# Patient Record
Sex: Female | Born: 1980 | Race: Black or African American | Hispanic: No | Marital: Single | Smoking: Never smoker
Health system: Southern US, Community
[De-identification: ages and names within clinical notes are randomized; demographics above are authoritative.]

## PROBLEM LIST (undated history)

## (undated) DIAGNOSIS — R87619 Unspecified abnormal cytological findings in specimens from cervix uteri: Secondary | ICD-10-CM

## (undated) DIAGNOSIS — M549 Dorsalgia, unspecified: Secondary | ICD-10-CM

## (undated) DIAGNOSIS — IMO0002 Reserved for concepts with insufficient information to code with codable children: Secondary | ICD-10-CM

## (undated) HISTORY — PX: COLPOSCOPY: SHX161

---

## 2003-11-18 ENCOUNTER — Inpatient Hospital Stay (HOSPITAL_COMMUNITY): Admission: EM | Admit: 2003-11-18 | Discharge: 2003-11-20 | Payer: Self-pay | Admitting: Emergency Medicine

## 2003-11-19 ENCOUNTER — Ambulatory Visit: Payer: Self-pay | Admitting: Internal Medicine

## 2005-02-03 ENCOUNTER — Emergency Department (HOSPITAL_COMMUNITY): Admission: EM | Admit: 2005-02-03 | Discharge: 2005-02-03 | Payer: Self-pay | Admitting: Emergency Medicine

## 2006-01-20 ENCOUNTER — Emergency Department (HOSPITAL_COMMUNITY): Admission: EM | Admit: 2006-01-20 | Discharge: 2006-01-20 | Payer: Self-pay | Admitting: Emergency Medicine

## 2006-01-31 ENCOUNTER — Emergency Department (HOSPITAL_COMMUNITY): Admission: EM | Admit: 2006-01-31 | Discharge: 2006-01-31 | Payer: Self-pay | Admitting: Family Medicine

## 2006-02-10 ENCOUNTER — Inpatient Hospital Stay (HOSPITAL_COMMUNITY): Admission: AD | Admit: 2006-02-10 | Discharge: 2006-02-10 | Payer: Self-pay | Admitting: Gynecology

## 2006-04-08 ENCOUNTER — Ambulatory Visit (HOSPITAL_COMMUNITY): Admission: RE | Admit: 2006-04-08 | Discharge: 2006-04-08 | Payer: Self-pay | Admitting: Gynecology

## 2006-05-12 ENCOUNTER — Emergency Department (HOSPITAL_COMMUNITY): Admission: EM | Admit: 2006-05-12 | Discharge: 2006-05-12 | Payer: Self-pay | Admitting: Emergency Medicine

## 2006-05-12 ENCOUNTER — Inpatient Hospital Stay (HOSPITAL_COMMUNITY): Admission: AD | Admit: 2006-05-12 | Discharge: 2006-05-12 | Payer: Self-pay | Admitting: Gynecology

## 2006-08-13 ENCOUNTER — Inpatient Hospital Stay (HOSPITAL_COMMUNITY): Admission: AD | Admit: 2006-08-13 | Discharge: 2006-08-13 | Payer: Self-pay | Admitting: Gynecology

## 2006-08-20 ENCOUNTER — Emergency Department (HOSPITAL_COMMUNITY): Admission: EM | Admit: 2006-08-20 | Discharge: 2006-08-20 | Payer: Self-pay | Admitting: Emergency Medicine

## 2006-08-29 ENCOUNTER — Encounter: Payer: Self-pay | Admitting: Family Medicine

## 2006-08-29 ENCOUNTER — Ambulatory Visit: Payer: Self-pay | Admitting: Family Medicine

## 2006-08-29 ENCOUNTER — Inpatient Hospital Stay (HOSPITAL_COMMUNITY): Admission: AD | Admit: 2006-08-29 | Discharge: 2006-09-01 | Payer: Self-pay | Admitting: Family Medicine

## 2007-01-05 ENCOUNTER — Encounter: Admission: RE | Admit: 2007-01-05 | Discharge: 2007-01-05 | Payer: Self-pay | Admitting: Internal Medicine

## 2008-02-15 ENCOUNTER — Inpatient Hospital Stay (HOSPITAL_COMMUNITY): Admission: AD | Admit: 2008-02-15 | Discharge: 2008-02-15 | Payer: Self-pay | Admitting: Obstetrics & Gynecology

## 2008-02-15 ENCOUNTER — Emergency Department (HOSPITAL_COMMUNITY): Admission: EM | Admit: 2008-02-15 | Discharge: 2008-02-15 | Payer: Self-pay | Admitting: Family Medicine

## 2008-02-18 ENCOUNTER — Inpatient Hospital Stay (HOSPITAL_COMMUNITY): Admission: AD | Admit: 2008-02-18 | Discharge: 2008-02-18 | Payer: Self-pay | Admitting: Obstetrics & Gynecology

## 2008-03-30 ENCOUNTER — Inpatient Hospital Stay (HOSPITAL_COMMUNITY): Admission: AD | Admit: 2008-03-30 | Discharge: 2008-03-30 | Payer: Self-pay | Admitting: Obstetrics & Gynecology

## 2008-04-02 ENCOUNTER — Ambulatory Visit (HOSPITAL_COMMUNITY): Admission: AD | Admit: 2008-04-02 | Discharge: 2008-04-02 | Payer: Self-pay | Admitting: Obstetrics & Gynecology

## 2008-04-02 ENCOUNTER — Encounter: Payer: Self-pay | Admitting: Obstetrics & Gynecology

## 2008-04-02 ENCOUNTER — Ambulatory Visit: Payer: Self-pay | Admitting: Obstetrics and Gynecology

## 2008-11-01 ENCOUNTER — Inpatient Hospital Stay (HOSPITAL_COMMUNITY): Admission: AD | Admit: 2008-11-01 | Discharge: 2008-11-01 | Payer: Self-pay | Admitting: Family Medicine

## 2008-12-27 ENCOUNTER — Emergency Department (HOSPITAL_COMMUNITY): Admission: EM | Admit: 2008-12-27 | Discharge: 2008-12-27 | Payer: Self-pay | Admitting: Family Medicine

## 2008-12-28 ENCOUNTER — Ambulatory Visit: Payer: Self-pay | Admitting: Obstetrics and Gynecology

## 2008-12-30 ENCOUNTER — Ambulatory Visit (HOSPITAL_COMMUNITY): Admission: RE | Admit: 2008-12-30 | Discharge: 2008-12-30 | Payer: Self-pay | Admitting: Obstetrics & Gynecology

## 2009-01-16 ENCOUNTER — Ambulatory Visit: Payer: Self-pay | Admitting: Family Medicine

## 2009-01-16 ENCOUNTER — Encounter: Payer: Self-pay | Admitting: Family Medicine

## 2009-01-16 LAB — CONVERTED CEMR LAB: ABO/RH(D): O POS

## 2009-01-19 LAB — CONVERTED CEMR LAB
Antibody Screen: NEGATIVE
Basophils Absolute: 0 10*3/uL (ref 0.0–0.1)
Basophils Relative: 0 % (ref 0–1)
Eosinophils Absolute: 0 10*3/uL (ref 0.0–0.7)
Eosinophils Relative: 1 % (ref 0–5)
HCT: 37.4 % (ref 36.0–46.0)
Hemoglobin: 12.8 g/dL (ref 12.0–15.0)
Hepatitis B Surface Ag: NEGATIVE
Lymphocytes Relative: 38 % (ref 12–46)
Lymphs Abs: 2.2 10*3/uL (ref 0.7–4.0)
MCHC: 34.2 g/dL (ref 30.0–36.0)
MCV: 84.2 fL (ref 78.0–100.0)
Monocytes Absolute: 0.4 10*3/uL (ref 0.1–1.0)
Monocytes Relative: 7 % (ref 3–12)
Neutro Abs: 3.2 10*3/uL (ref 1.7–7.7)
Neutrophils Relative %: 54 % (ref 43–77)
Platelets: 231 10*3/uL (ref 150–400)
RBC: 4.44 M/uL (ref 3.87–5.11)
RDW: 13.3 % (ref 11.5–15.5)
Rh Type: POSITIVE
Rubella: 3.8 intl units/mL
Sickle Cell Screen: NEGATIVE
WBC: 5.9 10*3/uL (ref 4.0–10.5)

## 2009-01-23 ENCOUNTER — Ambulatory Visit: Payer: Self-pay | Admitting: Family Medicine

## 2009-01-23 ENCOUNTER — Encounter: Payer: Self-pay | Admitting: Family Medicine

## 2009-01-23 ENCOUNTER — Other Ambulatory Visit: Admission: RE | Admit: 2009-01-23 | Discharge: 2009-01-23 | Payer: Self-pay | Admitting: Family Medicine

## 2009-01-23 LAB — CONVERTED CEMR LAB
Chlamydia, DNA Probe: NEGATIVE
GC Probe Amp, Genital: NEGATIVE
Whiff Test: NEGATIVE

## 2009-01-26 ENCOUNTER — Encounter: Payer: Self-pay | Admitting: Family Medicine

## 2009-01-26 DIAGNOSIS — O34219 Maternal care for unspecified type scar from previous cesarean delivery: Secondary | ICD-10-CM | POA: Insufficient documentation

## 2009-02-13 ENCOUNTER — Telehealth: Payer: Self-pay | Admitting: Family Medicine

## 2009-02-14 ENCOUNTER — Ambulatory Visit (HOSPITAL_COMMUNITY): Admission: RE | Admit: 2009-02-14 | Discharge: 2009-02-14 | Payer: Self-pay | Admitting: Obstetrics and Gynecology

## 2009-02-14 ENCOUNTER — Ambulatory Visit: Payer: Self-pay | Admitting: Obstetrics and Gynecology

## 2009-02-14 ENCOUNTER — Encounter: Payer: Self-pay | Admitting: Family Medicine

## 2009-02-14 ENCOUNTER — Ambulatory Visit (HOSPITAL_COMMUNITY): Admission: RE | Admit: 2009-02-14 | Discharge: 2009-02-14 | Payer: Self-pay | Admitting: Family Medicine

## 2009-02-14 ENCOUNTER — Ambulatory Visit: Payer: Self-pay | Admitting: Family Medicine

## 2009-02-15 ENCOUNTER — Encounter: Payer: Self-pay | Admitting: *Deleted

## 2009-02-17 ENCOUNTER — Encounter: Payer: Self-pay | Admitting: *Deleted

## 2009-02-24 ENCOUNTER — Telehealth (INDEPENDENT_AMBULATORY_CARE_PROVIDER_SITE_OTHER): Payer: Self-pay | Admitting: *Deleted

## 2009-03-15 ENCOUNTER — Encounter: Payer: Self-pay | Admitting: Family Medicine

## 2009-03-15 ENCOUNTER — Ambulatory Visit: Payer: Self-pay | Admitting: Obstetrics and Gynecology

## 2009-09-03 ENCOUNTER — Emergency Department (HOSPITAL_COMMUNITY): Admission: EM | Admit: 2009-09-03 | Discharge: 2009-09-03 | Payer: Self-pay | Admitting: Emergency Medicine

## 2009-09-18 ENCOUNTER — Inpatient Hospital Stay (HOSPITAL_COMMUNITY): Admission: AD | Admit: 2009-09-18 | Discharge: 2009-09-18 | Payer: Self-pay | Admitting: Obstetrics

## 2009-10-19 ENCOUNTER — Ambulatory Visit (HOSPITAL_COMMUNITY): Admission: RE | Admit: 2009-10-19 | Discharge: 2009-10-19 | Payer: Self-pay | Admitting: Sports Medicine

## 2009-10-19 ENCOUNTER — Encounter: Payer: Self-pay | Admitting: Sports Medicine

## 2009-10-19 ENCOUNTER — Encounter: Payer: Self-pay | Admitting: *Deleted

## 2009-10-19 ENCOUNTER — Ambulatory Visit: Payer: Self-pay | Admitting: Family Medicine

## 2009-11-20 ENCOUNTER — Encounter: Payer: Self-pay | Admitting: *Deleted

## 2009-12-05 ENCOUNTER — Ambulatory Visit (HOSPITAL_COMMUNITY): Admission: RE | Admit: 2009-12-05 | Discharge: 2009-12-05 | Payer: Self-pay | Admitting: Obstetrics

## 2010-02-27 NOTE — Miscellaneous (Signed)
Summary: OB ultrasound approved  Clinical Lists Changes medsolutions approved [place] #[id number].Marland KitchenGolden Circle RN  February 17, 2009 11:10 AM

## 2010-02-27 NOTE — Miscellaneous (Signed)
Summary: ob US approved  Clinical Lists Changes medsolutions approved the OB ultrasound #[id number].Marland KitchenGolden Circle RN  February 15, 2009 9:29 AM

## 2010-02-27 NOTE — Progress Notes (Signed)
Summary: triage  Phone Note Call from Patient Call back at [phone]   Caller: Patient Summary of Call: pt is pregnant and needs to talk to nurse Initial call taken by: De Nurse,  February 13, 2009 11:03 AM  Follow-up for Phone Call        left message Follow-up by: Golden Circle RN,  February 13, 2009 11:20 AM  Additional Follow-up for Phone Call Additional follow up Details #1::        states she is nervous since she had a miscarriage before. states she is almost 3 months. denies bleeding or discharge. no vomiting or cramps. states she does not feel pregnant any more. wants to be checked. appt tomorrow at 8:30. unable to come in today. has to work so could not come in pm to see pcp Additional Follow-up by: Golden Circle RN,  February 13, 2009 11:27 AM

## 2010-02-27 NOTE — Progress Notes (Signed)
Summary: triage  Phone Note Call from Patient Call back at Home Phone [phone]   Caller: Patient Summary of Call: wants to talk to nurse about her [facility] on the 18th and is still bleeding Initial call taken by: De Nurse,  February 24, 2009 2:14 PM  Follow-up for Phone Call        patient states she has a light bleeding using one to two pads daily. she does have some pain in right abdomen. no fever. consulted Dr. Darra Lis preceptor and she advises if any pain or fever she needs to be checked out at [facility] today. patient advised and voices understanding. also states she has a bad headache that tylenol is not helping. she has taken [facility] and does help. advised against BC and advised may try Ibuprofen. Follow-up by: Theresia Lo RN,  February 24, 2009 2:54 PM

## 2010-02-27 NOTE — Letter (Signed)
Summary: Out of Work  [facility]  [address]   [place], [place] [address]   Phone: [phone]  Fax: [phone]    October 19, 2009   Employee:  LUCETTE KRATZ    To Whom It May Concern:   For Medical reasons, please excuse the above named employee from work for the following dates:  Start:   09.22.2011    End:   09.22.2011  If you need additional information, please feel free to contact our office.         Sincerely,    Loralee Pacas CMA

## 2010-02-27 NOTE — Assessment & Plan Note (Signed)
Summary: fell last night & i spreg/[place]  fell last night & w/hx of miscarriages is worried about the pregnancy.Golden Circle RN  October 19, 2009 11:20 AM  Vital Signs:  Patient profile:   30 year old female Height:      64 inches Weight:      214 pounds BMI:     36.87 Temp:     98.1 degrees F Pulse rate:   50 / minute BP sitting:   110 / 74  Vitals Entered By: Golden Circle RN (October 19, 2009 11:19 AM)   Primary Care Provider:  Doree Albee MD   History of Present Illness: 30 yo [id number] at 12.1 wks here after a fall.  She gets her [facility] with Dr. Gaynell Face.  FEll last night, slipped and hit bottom, no LOC, did not hit belly at all.  No bleeding, no abd pain, no cramping, no fluid leakage.   Mild pain in low back.   Ambulatory.  Habits & Providers  Alcohol-Tobacco-Diet     Alcohol drinks/day: 0     Tobacco Status: never  Exercise-Depression-Behavior     Drug Use: never     Seat Belt Use: always  Current Medications (verified): 1)  None  Allergies (verified): No Known Drug Allergies  Social History: Risk analyst Use:  always Drug Use:  never  Review of Systems       See HPI  Physical Exam  General:  Well-developed,well-nourished,in no acute distress; alert,appropriate and cooperative throughout examination Lungs:  Normal respiratory effort, chest expands symmetrically. Lungs are clear to auscultation, no crackles or wheezes. Heart:  Normal rate and regular rhythm. S1 and S2 normal without gallop, murmur, click, rub or other extra sounds. Abdomen:  Gravid, appropriate for dates. Additional Exam:  Unable to doppler FHR, Office ultrasound used and fetus seen, good FHR at 145 BPM, placenta unremarkable.  Fetal movement seen.   Impression & Recommendations:  Problem # 1:  ACCIDENTAL FALL (ICD-E888.9) Assessment New In a [id number] at 12.1 weeks with 2 prior spont abortions and 1 therapeutic abortion.  Good FHR and fetal movement on office ultrasound, no  bleeding or cramping, sending to [facility] for official US scheduled at 2:30pm today.  Orders: [facility]- Est  Level 4 (06269) Prenatal U/S < 14 weeks - 48546  (Prenatal U/S)  Problem # 2:  PREGNANT STATE, INCIDENTAL (ICD-V22.2) Assessment: Unchanged See #1, followed by Dr. Gaynell Face.  Will forward office note and [place] results to him.  Patient Instructions: 1)  Great to meet you, 2)  We saw the baby's heart beating today. 3)  Please get your ultrasound. 4)  Also follow up with Dr Gaynell Face regarding your fall and prenatal care. 5)  Call if bleeding, cramps, or fluids leakage. 6)  -Dr. 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 test: 12/17/2008   Flowsheet View for Follow-up Visit    Weight:     214    Blood pressure:   110 / 74

## 2010-02-27 NOTE — Miscellaneous (Signed)
Summary: Walk in triage  Patient walked into clinic this afternoon at 4:15 pm concerned that she may be having a miscarriage.  States that she does not feel pregnant anymore and has been having a small amount of d/c over the past week.  Describes the d/c as clear initially but today it was pink tinged.  The amount is not even enough to saturate her underwear.  Denies abdominal pain, pressure or contractions.  Pt is not even receiving her OB care here.  Advised her to go to [facility] to be evaluated.  Pt insisting that she be seen.  Began crying stating that she has had 2 previous miscarriages and was concerned that she was losing this baby as well.  I took pt to an exam room and doppler revealed a FHR of 159 to 163.  Reassured pt that all was okay, that it was normal to have occasional spotting during her pregnancy.  Advised her to go to [facility] tonight if the discharge became bloody or if she developed abdominal pains.  Pt agreeable.

## 2010-02-27 NOTE — Assessment & Plan Note (Signed)
Summary: no longer feels pregnant/[place]   Vital Signs:  Patient profile:   30 year old female Weight:      212.3 pounds Pulse rate:   88 / minute BP sitting:   117 / 79  (right arm)  Vitals Entered By: Renato Battles slade,cma CC: pt states 'i do not feel pregnant anymore since saturday. no nausea. just a feeling that i am not pregnant'. this is pt's 5th pregnancy and pt had miscarriage in 3-10 PT DENIES PAIN, BLEEDING, CRAMPING. Is Patient Diabetic? No Pain Assessment Patient in pain? no        Primary Care Provider:  Doree Albee MD  CC:  pt states 'i do not feel pregnant anymore since saturday. no nausea. just a feeling that i am not pregnant'. this is pt's 5th pregnancy and pt had miscarriage in 3-10 PT DENIES PAIN, BLEEDING, and CRAMPING.Marland Kitchen  History of Present Illness: Patient comes in today as work-in for concerns about her pregnancy.  This is her 5th pregnancy.  2 term deliveries.  1 elective Ab and 1 spont Ab - in March of 2010.  States that since Saturday she just doesn't "feel pregnant anymore".  She has had no bleeding or cramping but no longer has breast tenderness or morning sickness.  She did take a home pregnancy test yesterday that was still positive.  She was seen for NOB appt approx 3 weeks ago.  Was only approx 9 weeks at that time, so heart tones not auscultated.  Exam normal at that time.  Echart/Emstat reviewed for events of this pregnancy: -Negative upreg 11/01/08 -States LMP 11-21-08 - bled one day and stopped.  Then bled 11-26-08 for 2 days. -Positive upreg at urgent care on 12/27/08 -Ultrasound done on 12-30-08 showing gestation sac c/w GA of [redacted]wks 5 days.  No fetal pole seen, but   would not yet be expected to be seen at that GA.   -NOB appt 01-23-09.  Today's GA by LMP of 11-21-08 is 12 wks 0 days.  Today's GA by the early ultrasound is 11wks 2 days.  Previous loss was at approx 12-14 weeks.   Habits & Providers  Alcohol-Tobacco-Diet     Tobacco Status:  never  Social History: Smoking Status:  never  Physical Exam  General:  alert and overweight-appearing.   Abdomen:  soft, obese, cannot appreciate gravid uterus (likely due to habitus as well as early GA), nontender.  Genitalia:  deferred as no bleeding or cramping Additional Exam:  Unable to auscultate FHT's. Bedside abdominal ultrasound performed.  Unable to visualize fetus in utero.     Impression & Recommendations:  Problem # 1:  SUPERVISION OF OTHER NORMAL PREGNANCY (ICD-V22.1) Assessment Unchanged Suspect missed Ab.  Will send to [facility] for ultrasound evaluation today to see if pregnancy is viable or not.  Believe general protocol is if missed Ab diagnosed in ultrasound, on call OB notified.  Will have them page me with results.   Update - Paged with results from ultrasound.  She has had demise of her pregnancy.  Has large, misshapen sac per radiologist.  tiny pole and tiny CRL.  No cardiac activity. Have called OB oncall.  Advised we would be happy to see her back here if she desires but ultimately patietn will need referral to [facility] OB clinic for discussion of how to treat - surgical vs. medical induction of bleeding.  Orders: Prenatal U/S < 14 weeks - 16109  (Prenatal U/S) Medicaid OB visit - [facility] [phone])

## 2010-04-13 LAB — WET PREP, GENITAL
Clue Cells Wet Prep HPF POC: NONE SEEN
Trich, Wet Prep: NONE SEEN
Yeast Wet Prep HPF POC: NONE SEEN

## 2010-04-13 LAB — GC/CHLAMYDIA PROBE AMP, GENITAL
Chlamydia, DNA Probe: NEGATIVE
GC Probe Amp, Genital: NEGATIVE

## 2010-04-15 ENCOUNTER — Inpatient Hospital Stay (HOSPITAL_COMMUNITY): Payer: Medicaid Other

## 2010-04-15 ENCOUNTER — Inpatient Hospital Stay (HOSPITAL_COMMUNITY)
Admission: AD | Admit: 2010-04-15 | Discharge: 2010-04-15 | Disposition: A | Discharge status: Home / Self Care | Payer: Medicaid Other | Source: Ambulatory Visit | Attending: Obstetrics | Admitting: Obstetrics

## 2010-04-15 DIAGNOSIS — O479 False labor, unspecified: Secondary | ICD-10-CM | POA: Insufficient documentation

## 2010-04-15 LAB — CBC
HCT: 36.6 % (ref 36.0–46.0)
Hemoglobin: 12.2 g/dL (ref 12.0–15.0)
MCHC: 33.4 g/dL (ref 30.0–36.0)
MCV: 86.6 fL (ref 78.0–100.0)
Platelets: 210 10*3/uL (ref 150–400)
RBC: 4.23 MIL/uL (ref 3.87–5.11)
RDW: 13 % (ref 11.5–15.5)
WBC: 6.3 10*3/uL (ref 4.0–10.5)

## 2010-04-15 LAB — TYPE AND SCREEN
ABO/RH(D): O POS
Antibody Screen: NEGATIVE

## 2010-04-18 ENCOUNTER — Inpatient Hospital Stay (HOSPITAL_COMMUNITY)
Admission: EM | Admit: 2010-04-18 | Discharge: 2010-04-20 | DRG: 775 | Disposition: A | Discharge status: Home / Self Care | Payer: Medicaid Other | Source: Ambulatory Visit | Attending: Obstetrics | Admitting: Obstetrics

## 2010-04-18 DIAGNOSIS — Z22330 Carrier of Group B streptococcus: Secondary | ICD-10-CM

## 2010-04-18 DIAGNOSIS — O99892 Other specified diseases and conditions complicating childbirth: Secondary | ICD-10-CM | POA: Diagnosis present

## 2010-04-18 LAB — POCT URINALYSIS DIP (DEVICE)
Bilirubin Urine: NEGATIVE
Glucose, UA: NEGATIVE mg/dL
Hgb urine dipstick: NEGATIVE
Ketones, ur: NEGATIVE mg/dL
Nitrite: NEGATIVE
Protein, ur: NEGATIVE mg/dL
Specific Gravity, Urine: 1.025 (ref 1.005–1.030)
Urobilinogen, UA: 0.2 mg/dL (ref 0.0–1.0)
pH: 5.5 (ref 5.0–8.0)

## 2010-04-18 LAB — CBC
HCT: 35.4 % — ABNORMAL LOW (ref 36.0–46.0)
Hemoglobin: 11.8 g/dL — ABNORMAL LOW (ref 12.0–15.0)
MCH: 28.8 pg (ref 26.0–34.0)
MCHC: 33.3 g/dL (ref 30.0–36.0)
MCV: 86.3 fL (ref 78.0–100.0)
Platelets: 145 10*3/uL — ABNORMAL LOW (ref 150–400)
RBC: 4.1 MIL/uL (ref 3.87–5.11)
RDW: 14.5 % (ref 11.5–15.5)
WBC: 5.8 10*3/uL (ref 4.0–10.5)

## 2010-04-18 LAB — RPR: RPR Ser Ql: NONREACTIVE

## 2010-04-19 LAB — CBC
HCT: 34.2 % — ABNORMAL LOW (ref 36.0–46.0)
Hemoglobin: 11 g/dL — ABNORMAL LOW (ref 12.0–15.0)
MCHC: 32.2 g/dL (ref 30.0–36.0)
MCV: 87.7 fL (ref 78.0–100.0)

## 2010-05-02 LAB — POCT URINALYSIS DIP (DEVICE)
Glucose, UA: NEGATIVE mg/dL
Ketones, ur: NEGATIVE mg/dL
Protein, ur: NEGATIVE mg/dL
Urobilinogen, UA: 0.2 mg/dL (ref 0.0–1.0)

## 2010-05-02 LAB — URINE CULTURE

## 2010-05-03 LAB — GC/CHLAMYDIA PROBE AMP, GENITAL: GC Probe Amp, Genital: NEGATIVE

## 2010-05-03 LAB — POCT PREGNANCY, URINE: Preg Test, Ur: NEGATIVE

## 2010-05-03 LAB — URINALYSIS, ROUTINE W REFLEX MICROSCOPIC
Glucose, UA: NEGATIVE mg/dL
Hgb urine dipstick: NEGATIVE
Specific Gravity, Urine: 1.015 (ref 1.005–1.030)

## 2010-05-10 LAB — URINALYSIS, ROUTINE W REFLEX MICROSCOPIC
Leukocytes, UA: NEGATIVE
Nitrite: NEGATIVE
Specific Gravity, Urine: 1.025 (ref 1.005–1.030)
pH: 6 (ref 5.0–8.0)

## 2010-05-10 LAB — COMPREHENSIVE METABOLIC PANEL
ALT: 16 U/L (ref 0–35)
Alkaline Phosphatase: 44 U/L (ref 39–117)
CO2: 25 mEq/L (ref 19–32)
Glucose, Bld: 99 mg/dL (ref 70–99)
Potassium: 3.4 mEq/L — ABNORMAL LOW (ref 3.5–5.1)
Sodium: 137 mEq/L (ref 135–145)
Total Protein: 7.3 g/dL (ref 6.0–8.3)

## 2010-05-10 LAB — WET PREP, GENITAL: Yeast Wet Prep HPF POC: NONE SEEN

## 2010-05-10 LAB — GC/CHLAMYDIA PROBE AMP, GENITAL: GC Probe Amp, Genital: NEGATIVE

## 2010-05-10 LAB — CBC
Hemoglobin: 13.3 g/dL (ref 12.0–15.0)
MCHC: 34 g/dL (ref 30.0–36.0)
MCHC: 34 g/dL (ref 30.0–36.0)
MCV: 88 fL (ref 78.0–100.0)
Platelets: 154 10*3/uL (ref 150–400)
RBC: 3.42 MIL/uL — ABNORMAL LOW (ref 3.87–5.11)
RDW: 12.9 % (ref 11.5–15.5)
RDW: 12.9 % (ref 11.5–15.5)

## 2010-05-10 LAB — URINE MICROSCOPIC-ADD ON

## 2010-05-10 LAB — HCG, QUANTITATIVE, PREGNANCY: hCG, Beta Chain, Quant, S: 3387 m[IU]/mL — ABNORMAL HIGH (ref <5)

## 2010-05-14 LAB — DIFFERENTIAL
Lymphs Abs: 2.5 10*3/uL (ref 0.7–4.0)
Monocytes Relative: 8 % (ref 3–12)
Neutro Abs: 3.7 10*3/uL (ref 1.7–7.7)
Neutrophils Relative %: 54 % (ref 43–77)

## 2010-05-14 LAB — POCT URINALYSIS DIP (DEVICE)
Glucose, UA: NEGATIVE mg/dL
Nitrite: NEGATIVE
Urobilinogen, UA: 0.2 mg/dL (ref 0.0–1.0)

## 2010-05-14 LAB — POCT I-STAT, CHEM 8
BUN: 6 mg/dL (ref 6–23)
Creatinine, Ser: 0.9 mg/dL (ref 0.4–1.2)
Hemoglobin: 13.6 g/dL (ref 12.0–15.0)
Potassium: 3.4 mEq/L — ABNORMAL LOW (ref 3.5–5.1)
Sodium: 136 mEq/L (ref 135–145)

## 2010-05-14 LAB — WET PREP, GENITAL: Yeast Wet Prep HPF POC: NONE SEEN

## 2010-05-14 LAB — POCT PREGNANCY, URINE: Preg Test, Ur: POSITIVE

## 2010-05-14 LAB — CBC
Platelets: 200 10*3/uL (ref 150–400)
RBC: 4.41 MIL/uL (ref 3.87–5.11)
WBC: 6.9 10*3/uL (ref 4.0–10.5)

## 2010-05-14 LAB — GC/CHLAMYDIA PROBE AMP, GENITAL
Chlamydia, DNA Probe: NEGATIVE
GC Probe Amp, Genital: NEGATIVE

## 2010-05-14 LAB — HCG, QUANTITATIVE, PREGNANCY: hCG, Beta Chain, Quant, S: 50261 m[IU]/mL — ABNORMAL HIGH (ref <5)

## 2010-06-12 NOTE — Discharge Summary (Signed)
NAME:  Cynthia Zimmerman, Cynthia Zimmerman               ACCOUNT NO.:  [id number]   MEDICAL RECORD NO.:  [id number]          PATIENT TYPE:  INP   LOCATION:  9134                          [facility]   PHYSICIAN:  Phil D. Okey Dupre, M.D.     DATE OF BIRTH:  1980-08-28   DATE OF ADMISSION:  08/29/2006  DATE OF DISCHARGE:  09/01/2006                               DISCHARGE SUMMARY   ADMITTING DIAGNOSIS:  Spontaneous rupture of membranes.   DISCHARGE DIAGNOSES:  Status post low transverse cesarean section  secondary to breech presentation.   HOSPITAL COURSE:  This was a G 3, P 1-0-1-1 who initially presented with  spontaneous rupture of membranes.  Upon exam was noted to be in a breech  position.  Patient had external maneuvers performed that were  unsuccessful.  Patient was later sent to the operating room for C-  section.  Patient underwent a primary low transverse C-section by Dr.  Shawnie Pons with spinal and local anesthesia, resulted in a viable female  infant with Apgar scores of 9 at one minute and 9 at five minutes with a  weight of 7 pounds 7 ounces.  Estimated blood loss of 1,000 mL with no  complications.  Infant was sent to newborn nursery.  Mom was in stable  status thereafter.  Mother has been doing well since her procedure with  minimal vaginal bleeding, and her suture site has been clean, dry and  intact with no signs of fever.  Vital signs have been stable.  Mother is  breast feeding, and pain is controlled with Motrin.  Patient desired  oral contraceptives for birth control and will follow up in six weeks  with her primary care physician.  Patient had no OB risk factors upon  admission.  Had O+ positive type blood, antibody negative, serology was  nonreactive RPR.  Was not immune to rubella titer.  Was HBS negative and  HIV negative upon prenatal care.  Patient was doing well upon discharge.   DISCHARGE MEDICATIONS:  Patient was discharged with Motrin 600 mg every  six hours as needed for pain.   Patient was also discharged on Micronor  one pill per day.   DISCHARGE INSTRUCTIONS:  Patient is to have followup with her primary  care physician in six weeks.  Patient was to have her staples removed at  home via [facility].  Patient was instructed not to insert anything  vaginally for the next six weeks and not to do any heavy lifting for the  next six weeks.  Patient was also to take medications as described in  above paragraph.      Marisue Ivan, MD      Javier Glazier. Okey Dupre, M.D.  Electronically Signed    KL/MEDQ  D:  09/01/2006  T:  09/01/2006  Job:  [id number]

## 2010-06-12 NOTE — Op Note (Signed)
NAME:  Cynthia Zimmerman, Cynthia Zimmerman               ACCOUNT NO.:  [id number]   MEDICAL RECORD NO.:  [id number]          PATIENT TYPE:  AMB   LOCATION:  MATC                          [facility]   PHYSICIAN:  Scheryl Darter, MD       DATE OF BIRTH:  March 24, 1980   DATE OF PROCEDURE:  04/02/2008  DATE OF DISCHARGE:                               OPERATIVE REPORT   PROCEDURE:  Suction dilatation and curettage.   PREOPERATIVE DIAGNOSIS:  Incomplete abortion at 13 weeks' gestation.   POSTOPERATIVE DIAGNOSIS:  Incomplete abortion at 13 weeks' gestation.   SURGEON:  Scheryl Darter, MD   ANESTHESIA:  Paracervical block with IV sedation.   ESTIMATED BLOOD LOSS:  75 mL.   SPECIMENS:  Products of conception.   COMPLICATIONS:  None.   DRAINS:  None.   COUNTS:  Correct.   OPERATIVE COURSE:  The patient gave written consent for dilatation and  curettage.  She had been treated with Cytotec for incomplete miscarriage  on March 30, 2008.  The patient was at 13 weeks' gestation.  She states  that she passed the fetus  but was still having some bleeding and  cramping.  Diagnosis was made of incomplete abortion.  The patient  identification was confirmed.  She was brought to the OR and she was  given sedation with Fentanyl and Versed.  She was placed in dorsal  lithotomy position.  Perineum and vagina were sterilely prepped and  draped.  Bladder was drained with a red rubber catheter with 100 mL of  urine drained.  Speculum was inserted and cervix was infiltrated with 1%  lidocaine for paracervical block.  The anterior lip of the cervix was  grasped with single tooth tenaculum.  The uterus sounded to 10 cm.  Cervix was dilated sufficiently, passed 9-mm suction curette.  Suction  curettage was performed with removal of products of conception.  The  patient received IV Pitocin during the procedure.  Complete evacuation  of uterine cavity was assured.  There was normal bleeding at the end of  the procedure.  All  instruments were removed.  The patient tolerated the  procedure well without complication.  She was brought in stable  condition to recovery room.     Scheryl Darter, MD  Electronically Signed    JA/MEDQ  D:  04/02/2008  T:  04/03/2008  Job:  [id number]

## 2010-06-12 NOTE — Op Note (Signed)
NAME:  Cynthia Zimmerman, Cynthia Zimmerman               ACCOUNT NO.:  [id number]   MEDICAL RECORD NO.:  [id number]          PATIENT TYPE:  INP   LOCATION:  9134                          [facility]   PHYSICIAN:  Tanya S. Shawnie Pons, M.D.   DATE OF BIRTH:  [birth year]   DATE OF PROCEDURE:  08/29/2006  DATE OF DISCHARGE:                               OPERATIVE REPORT   PREOPERATIVE DIAGNOSES:  1. Intrauterine pregnancy at 39 weeks.  2. Premature rupture of membranes.  3  Breech presentation.   POSTOPERATIVE DIAGNOSES:  1. Intrauterine pregnancy at 39 weeks.  2. Premature rupture of membranes.  3  Breech presentation.   PROCEDURE:  Primary low transverse C-section.   SURGEON:  Dr. Tinnie Gens.   ANESTHESIA:  Spinal, patch and local.   FINDINGS:  Viable female infant, Apgar's 9 and 9, weight 7 pounds 7  ounces.   ESTIMATED BLOOD LOSS:  1000 mL.   COMPLICATIONS:  None.   Placenta to pathology.   INDICATIONS FOR PROCEDURE:  Briefly, the patient is a 30 year old,  gravida 3, para 1-0-1-1 whose at 28 weeks who presents with rupture of  membranes.  She was admitted to labor and delivery because she was  grossly ruptured. Upon checking her, she was 1 cm and out of the pelvis.  It was felt that the patient was likely to be in a breech presentation.  Ultrasound confirmed this.  The patient was counseled, she did desire  attempted external cephalic version, however, given the already ruptured  membranes this was not easy even after giving the patient revealing  terbutaline and the baby did not made.  Mom and baby tolerated the  procedure well but the decision was made to proceed with C-section.   DESCRIPTION OF PROCEDURE:  The patient was taken OR.  She was placed in  the supine position in a left lateral tilt after spinal analgesia was  administered.  She was prepped and draped in the usual sterile fashion,  a Foley catheter placed inside the bladder.  When anesthesia was felt to  be adequate, a  Pfannenstiel incision was made with a knife to underlying  skin and subcuticular tissues, taken down to the fascia which was  divided in the midline.  The fascial incision was extended bluntly.  The  rectus muscles were bluntly separated and the peritoneal cavity entered  bluntly.  The bladder blade was placed inside the incision.  A low  transverse incision was made on the uterus. The infant was noted to be  in a frank breech presentation. The infant was delivered without  difficulty.  The cord was clamped x2, infant bulb suctioned and infant  taken to awaiting peds.  Cord blood was obtained.  The placenta was  delivered from the uterus.  The uterus cleaned out with a dry lap pad.  The edges of the uterine incision were grasped with ring forceps.  The  uterine incision closed with #0 Vicryl suture in a locked running  fashion.  A second imbricating layer was used to create hemostasis.  Excellent hemostasis was achieved.  Attention was then turned  to the  fascia which was closed with a #0 Vicryl suture in a running fashion.  The subcuticular tissue was irrigated and any bleeders cauterized with  the electrocautery.  Skin closed using clips.  30 mL of 0.25% Marcaine  were injected around the incision. A pressure dressing was applied.  The  patient was awakened and taken to the recovery room in stable condition.      Shelbie Proctor. Shawnie Pons, M.D.  Electronically Signed     TSP/MEDQ  D:  08/29/2006  T:  08/29/2006  Job:  [id number]

## 2010-06-15 NOTE — Discharge Summary (Signed)
NAME:  Cynthia Zimmerman, Cynthia Zimmerman               ACCOUNT NO.:  [id number]   MEDICAL RECORD NO.:  [id number]          PATIENT TYPE:  INP   LOCATION:  3302                         FACILITY:  [facility]   PHYSICIAN:  Mobolaji B. Bakare, M.D.DATE OF BIRTH:  17-Oct-1980   DATE OF ADMISSION:  11/18/2003  DATE OF DISCHARGE:  11/20/2003                                 DISCHARGE SUMMARY   PRIMARY CARE PHYSICIAN:  Renaye Rakers, M.D.   INFECTIOUS DISEASE:  Cliffton Asters, M.D.   FINAL DIAGNOSES:  1.  Malaria fever.  2.  Hemolytic anemia.  3.  Thrombocytopenia.   BRIEF HISTORY:  Ms. Dombek is a 30 year old African lady who immigrated  from [place].  She presented with a three-day history of fever, nausea,  vomiting and headaches associated with chills and rigors.  She claimed that  she had malaria back in [place].  She did not have any neck stiffness.  No  change in her vision, no loss of consciousness, no confusion, no GI symptoms  and no urinary symptoms.  Please refer to H&P for further details.   PERTINENT FINDINGS ON PHYSICAL EXAMINATION:  Vitals on initial admission  were temperature 104.4 degrees, blood pressure 94/48, pulse rate 130,  respiratory rate 28 and O2 saturation 100% on room air.  On general  examination, she was alert and oriented and was icteric.  The rest of the  physical examination was unremarkable, except for tachycardia on CVS.   INITIAL LABORATORY DATA:  White cell count of 5.7, hemoglobin of 11.3,  platelets of 91.  UA was positive for bilirubin, ketones and small  mucocytes.  Hepatic profile showed AST 28, ALT 21, ALP 37 and total  bilirubin 3.8.  Urine toxic screen was negative.  Blood smear was positive  for malaria parasite, however, was unable to speciate this parasite.  Iron  studies:  Iron 40, TIBC 234, percent saturation 17%, ferritin 215.  CBC on  discharge with white cell count of 4.8, hematocrit of 36.3, hemoglobin of  9.2, platelets of 95, MCV 84%, RDW 13.9, neutrophils  24, lymphocytes 50 and  monocytes 22%.  Urine culture was negative.  Chest x-ray was normal.   HOSPITAL COURSE:  The patient was admitted and treated with doxycycline and  quinine.  She was able to tolerated the quinine without significant side  effect.  She was also rehydrated and blood flow improved.  The blood  pressure on discharge was 110/60.  The patient remained stable during the  hospitalization.  It was deemed that she could continue with medications  p.o. at home.   DISCHARGE MEDICATIONS:  She was discharge home on:  1.  Doxycycline 100 mg p.o. b.i.d. for six more days.  2.  Quinine sulfate 650 mg p.o. t.i.d. for six more days.  3.  Multivitamins.  4.  Tylenol for fever.   FOLLOWUP:  CBC and differential in two weeks.  Report to be faxed to Dr.  Parke Simmers.  She should follow up with Dr. Parke Simmers in one to two weeks.       MBB/MEDQ  D:  11/20/2003  T:  11/20/2003  Job:  [id number]   cc:   Renaye Rakers, M.D.  [phone] [address]  [place]  [place] [address]  Fax: [phone]   Cliffton Asters, M.D.  [address] [place]  [place] [address]  Fax: [phone]

## 2010-06-15 NOTE — H&P (Signed)
NAME:  Cynthia Zimmerman, Cynthia Zimmerman               ACCOUNT NO.:  [id number]   MEDICAL RECORD NO.:  [id number]          PATIENT TYPE:  INP   LOCATION:  1849                         FACILITY:  [facility]   PHYSICIAN:  Lonia Blood, M.D.      DATE OF BIRTH:  09-Oct-1980   DATE OF ADMISSION:  11/18/2003  DATE OF DISCHARGE:                                HISTORY & PHYSICAL   PRIMARY CARE PHYSICIAN:  Renaye Rakers, M.D.   PRESENTING COMPLAINT:  Fever, headache, and joint pain for three days.   HISTORY OF PRESENT ILLNESS:  This is a 30 year old recent immigrant from  [place] who presented with three days of fever, nausea, and vomiting x 1 as  well as headache.  Fever was sudden onset, and per patient had no other  symptoms prior to the onset of the fever.  It was associated with joint pain  but denies any cough or urinary symptoms or neck stiffness.  The patient  also denied any confusion.  She has only noted that her urine has changed  color to yellow in this period of fever.  Per patient, she has had multiple  episodes of malaria back in [place] prior to arrival in [place]  Her current  symptoms seem to be what she had before.  The patient denies GI symptoms at  this point except the nausea and vomiting x 1 that she had.   PAST MEDICAL HISTORY:  No significant disease.   MEDICATIONS:  None.   ALLERGIES:  The patient has no known drug allergies.   SOCIAL HISTORY:  The patient lives in [place] with her partner.  She was  originally born in [place] but moved to [place] after school, [place] this [place] and has lived in [place] since then.  She denied any  tobacco or alcohol use and no drug use.   FAMILY HISTORY:  The patient's family are back in [place], and she has no  idea of any medical disease in her family and no knowledge of such.   REVIEW OF SYSTEMS:  Mainly generalized headache and fever which comes with  the fever as well.  Denies any weight gain or weight loss.  HEENT:  The  patient  has dry mouth, otherwise have also noticed some jaundice as informed  by her partner.  RESPIRATORY:  Denied any chest pain, cough, shortness of  breath.  CARDIOVASCULAR:  Also denied any chest pain, PND, orthopnea, or leg  swelling.  ABDOMEN:  As in HPI.  Denied an constipation or diarrhea.  MUSCULOSKELETAL:  Positive for generalized aches, nonspecific but through  the muscles and the joints all over the body.  GU:  Yellow urine, denied any  dysuria or polyuria or frequency.   PHYSICAL EXAMINATION:  VITAL SIGNS:  Temperature 104.4, blood pressure  94/48, pulse 130, respiratory rate 28, O2 saturation 100% on room air.  GENERAL:  The patient is acutely ill looking but very conversant.  Alert and  oriented, not confused and no evidence of altered mental status.  HEENT:  The patient is positive for jaundice, no pallor.  NECK:  Supple.  No JVD, no lymphadenopathy.  CHEST:  Good air entry bilaterally, no wheezes or rales.  CARDIOVASCULAR;  The patient has tachycardia, normal S1 and S2.  ABDOMEN:  Obese, soft, nontender, with positive bowel sounds.  GU:  Deferred.  EXTREMITIES:  No edema, cyanosis, or clubbing.  SKIN:  Hot, dry, and flushed but no rashes.   LABORATORY AND X-RAY DATA:  White count 5.7, hemoglobin 11.3, and platelets  91 with normal differential.  UA showed hazy, amber urine, positive for  bilirubin, positive for ketones 40, positive for urobilinogen 19, positive  for nitrite, small leukocyte esterase.  Urine microscopy showed urine wbc's  0 to 2 and few bacteria.   IMPRESSION:  A young, healthy lady, 30 year old, presenting with severe  fever which is acute in onset.  Historically speaking, evidence seems to  point to possible malaria based on the history, although the incubation  period is a very long period.  However, it is very well known that species  of malaria especially can be recurrent and unremitting over a long period of  time, and this may be an example.  Other  differentials, however, need to be  considered in this case, especially with hypertension and low platelets, we  need to consider possibility of sepsis in this case which could be from the  urinary tract or not related.  We will, therefore, proceed as follows.   1.  Fever:  We will treat this empirically for malaria using quinine and      doxycycline combination.  At the same time, we will get blood cultures,      urine cultures, check a chest x-ray now, check CBC and serial blood      smears for malaria over the next 12 hours.  If we get any confirmation,      we will think about more definite treatment.  Other treatments including      __________ could be considered depending on how the patient responds to      initial treatment.  Meanwhile we will put the patient in the stepdown      and monitor her vital signs closely to see if the symptoms resolve.      Will use some Tylenol for fever control.  2.  Thrombocytopenia.  This could be related to her fever and, again, the      same differentials apply including the malaria and could be some element      of sepsis.  Will continue to monitor her platelet levels at this point.  3.  Anemia.  This is mild; however, the patient has not been eating or      drinking, and this could be hemoconcentration.  We will hydrate patient      and recheck hemoglobin to see what it looks like.  4.  Jaundice.  The patient looks jaundiced as mentioned. Currently her      chemistries are not back.  Will check a CMET on patient and see what her      bilirubin will be, although there is evidence of bilirubin in her urine.      We will also look at the type of bilirubin she has, whether this is      going to be direct or indirect, further supporting the diagnosis, one or      the other causes.  5.  Disposition.  At this point, I am not inclined to broad-spectrum     antibiotic coverage noting the  age of the patient, her current      presentation, and the fact that  she does not even have an elevated white      blood count which      makes it less likely to be sepsis.  However of note is blood pressure.      The hypotension needs to be treated.  We will give her some fluids,      although I do not know the baseline blood pressure of the patient, but      it was a little bit low which could also be related to any of the      differentials mentioned above.       LG/MEDQ  D:  11/18/2003  T:  11/18/2003  Job:  [id number]   cc:   Renaye Rakers, M.D.  [facility] [address]  [place]  [place] [address]  Fax: [phone]

## 2010-11-12 LAB — URINALYSIS, ROUTINE W REFLEX MICROSCOPIC
Glucose, UA: NEGATIVE
Ketones, ur: NEGATIVE
Nitrite: NEGATIVE
Specific Gravity, Urine: 1.02
pH: 6

## 2010-11-12 LAB — CBC
HCT: 30.9 — ABNORMAL LOW
Hemoglobin: 12.2
MCV: 86
Platelets: 132 — ABNORMAL LOW
RBC: 3.59 — ABNORMAL LOW
RBC: 4.16
RDW: 14.8 — ABNORMAL HIGH
WBC: 7.1

## 2010-11-12 LAB — RPR: RPR Ser Ql: NONREACTIVE

## 2010-11-18 ENCOUNTER — Inpatient Hospital Stay (INDEPENDENT_AMBULATORY_CARE_PROVIDER_SITE_OTHER)
Admission: RE | Admit: 2010-11-18 | Discharge: 2010-11-18 | Disposition: A | Discharge status: Home / Self Care | Payer: Self-pay | Source: Ambulatory Visit | Attending: Family Medicine | Admitting: Family Medicine

## 2010-11-18 DIAGNOSIS — K089 Disorder of teeth and supporting structures, unspecified: Secondary | ICD-10-CM

## 2010-11-18 LAB — POCT URINALYSIS DIP (DEVICE)
Ketones, ur: NEGATIVE mg/dL
Protein, ur: NEGATIVE mg/dL
Specific Gravity, Urine: 1.015 (ref 1.005–1.030)
pH: 6 (ref 5.0–8.0)

## 2010-11-18 LAB — POCT PREGNANCY, URINE: Preg Test, Ur: NEGATIVE

## 2011-03-16 IMAGING — US US OB COMP LESS 14 WK
1 series · 14 of 28 positions shown · non-contrast
Comparison: none

OBSTETRICAL ULTRASOUND:
 This ultrasound exam was performed in the [HOSPITAL] Ultrasound Department.  The OB US report was generated in the AS system, and faxed to the ordering physician.  This report is also available in [HOSPITAL]?s Access[employer] and in [REDACTED] PACS.

[Series 1: us ob comp less 14 wks · 0.15mm/px · 14 of 38 slices shown]
[im 2/38]
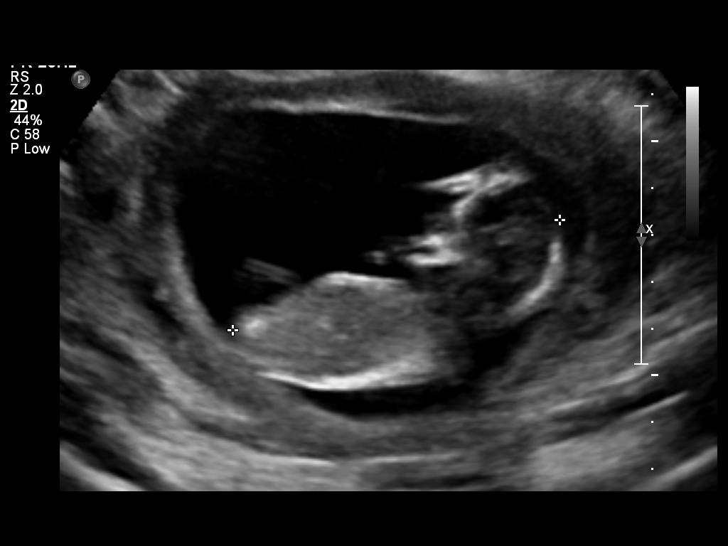
[im 5/38]
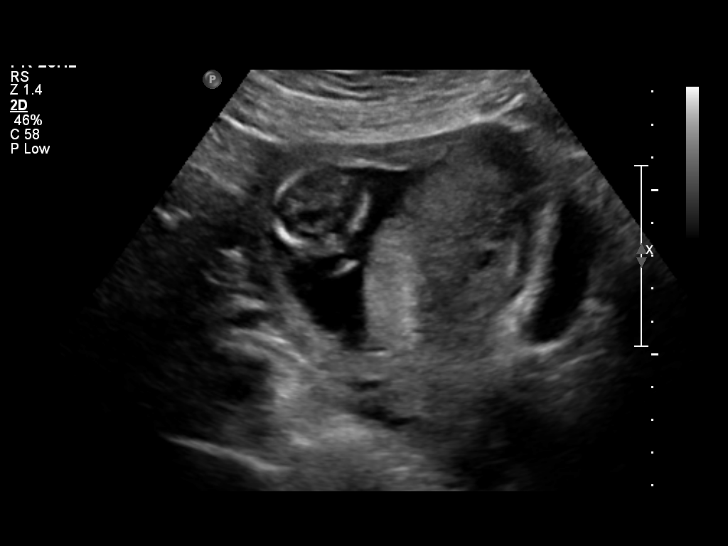
[im 7/38]
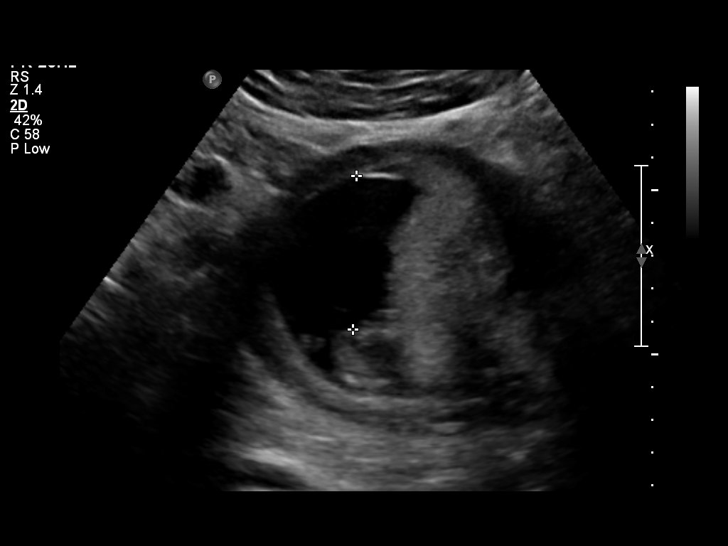
[im 10/38]
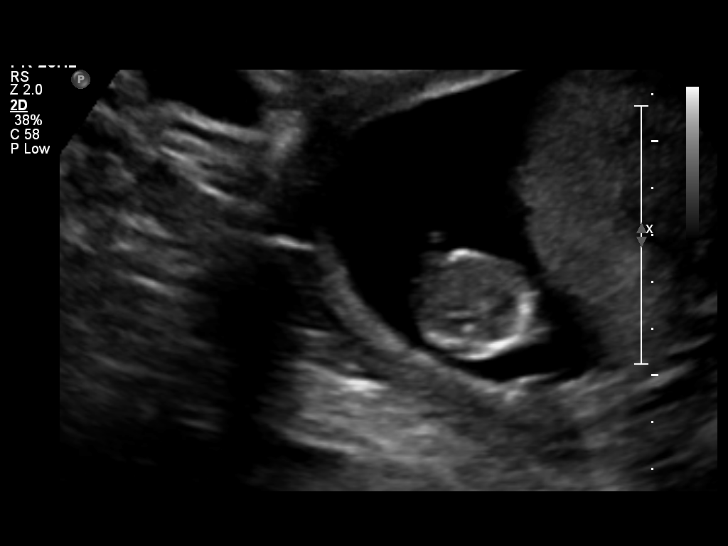
[im 13/38]
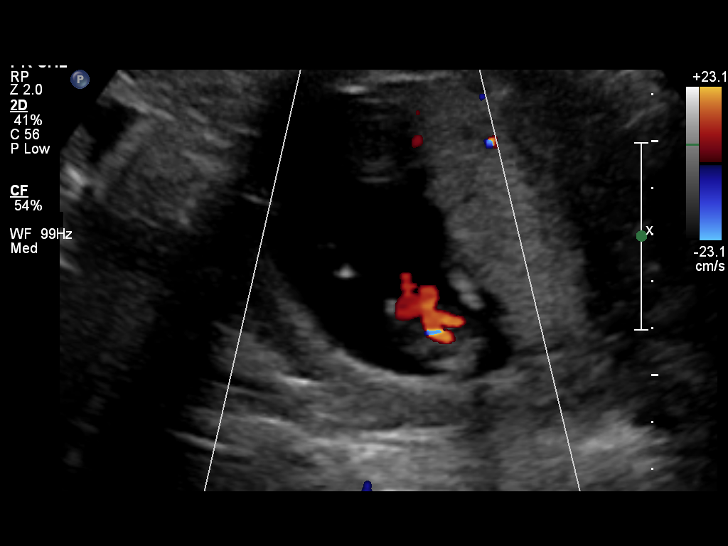
[im 16/38]
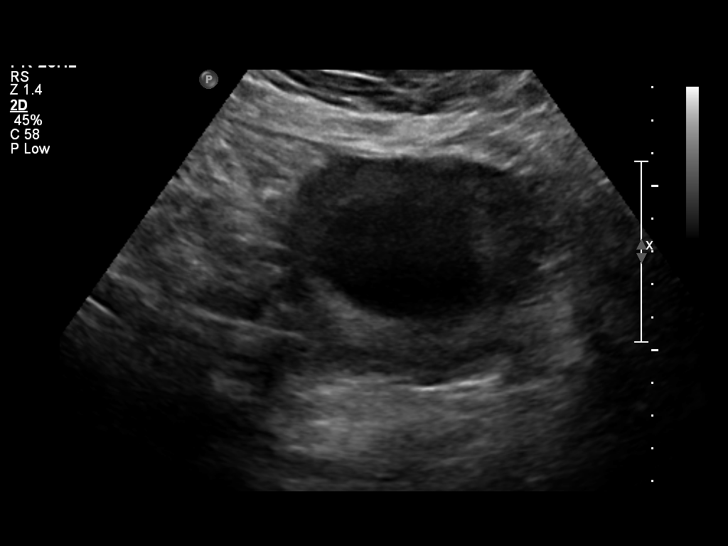
[im 18/38]
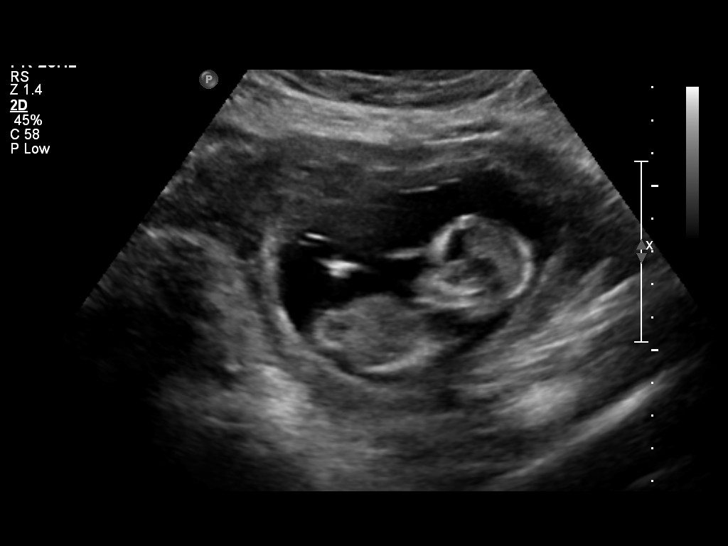
[im 21/38]
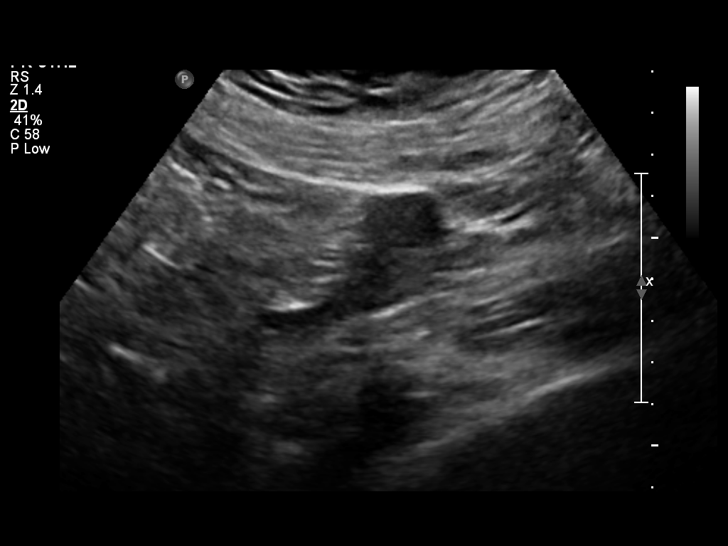
[im 24/38]
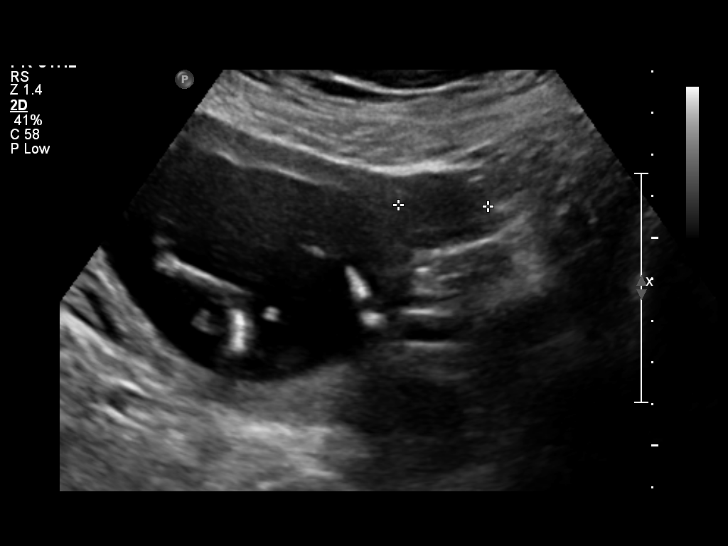
[im 27/38]
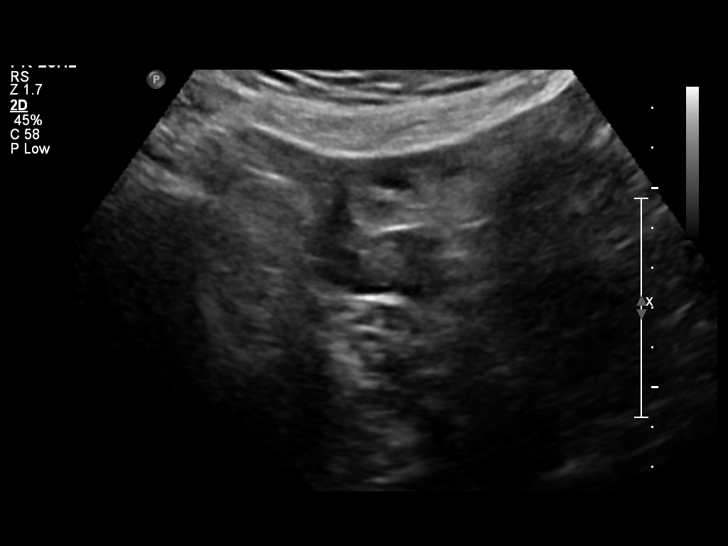
[im 29/38]
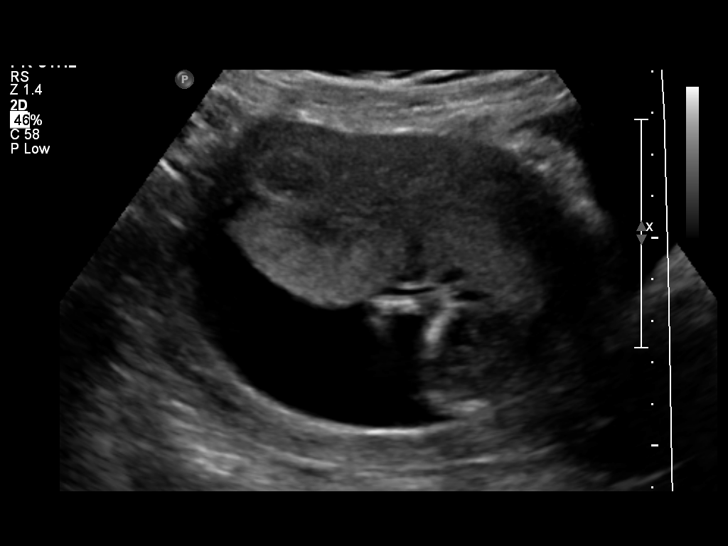
[im 32/38]
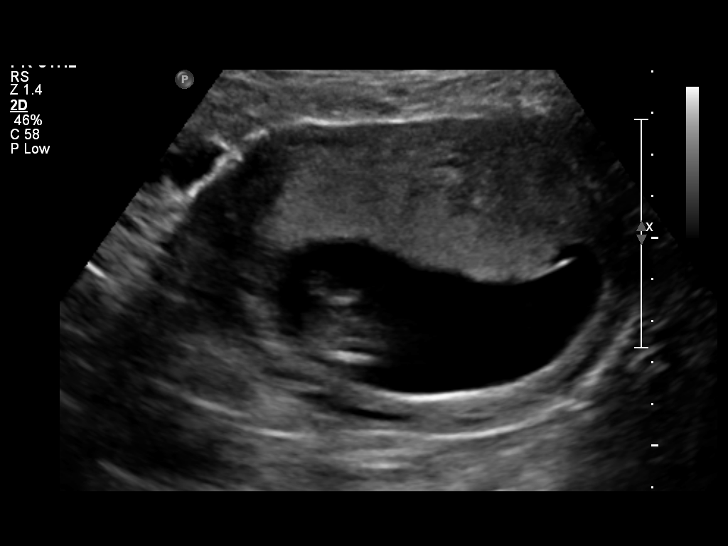
[im 35/38]
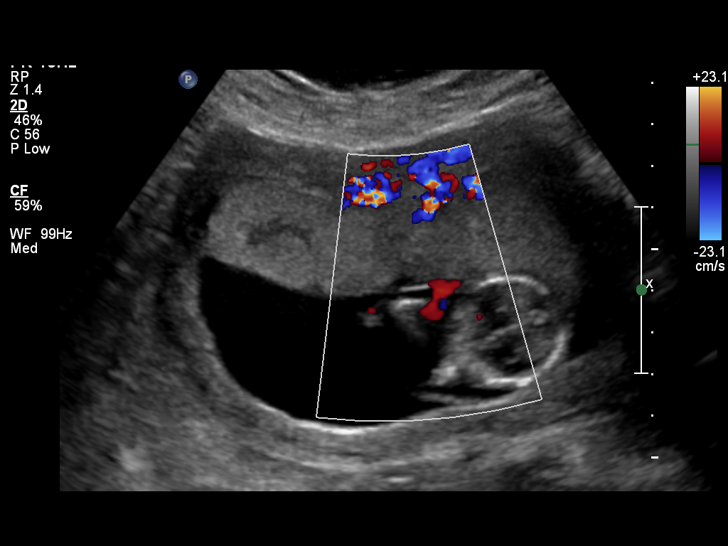
[im 38/38]
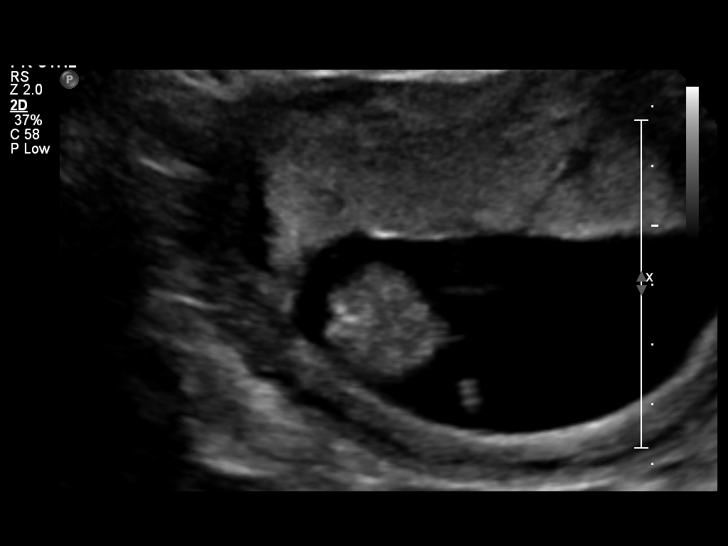

[14 of 28 positions shown; findings below may reference images not displayed]

IMPRESSION: See AS Obstetric US report.

## 2011-05-02 IMAGING — US US OB COMP +14 WK
1 series · 14 of 28 positions shown · non-contrast
Comparison: none

OBSTETRICAL ULTRASOUND:
 This ultrasound exam was performed in the [HOSPITAL] Ultrasound Department.  The OB US report was generated in the AS system, and faxed to the ordering physician.  This report is also available in [HOSPITAL]?s [employer] and in [REDACTED] PACS.

[Series 1: us ob comp +14 wk · 55 acquisitions, 14 frames shown]
[im 3/55]
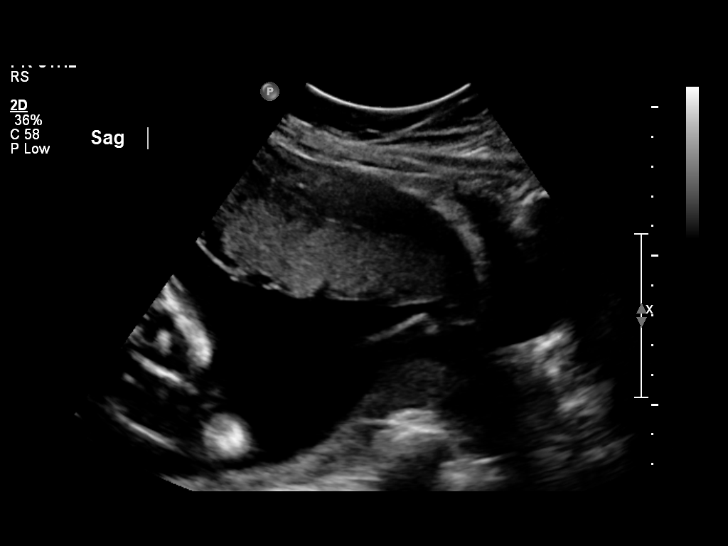
[im 7/55]
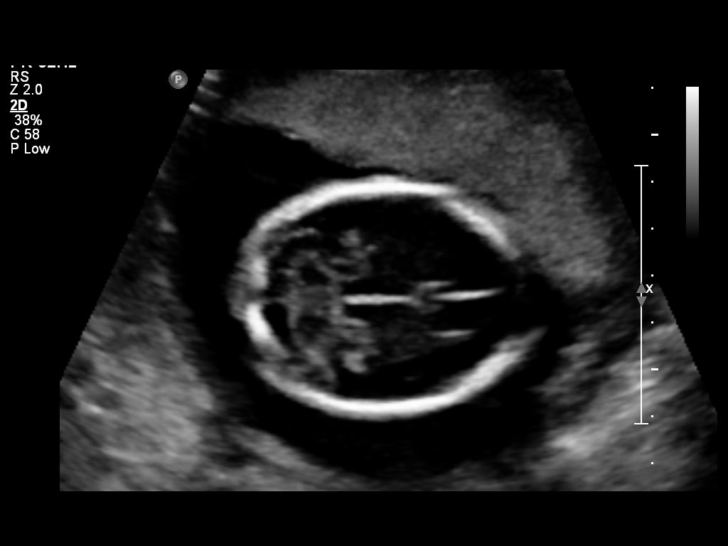
[im 11/55]
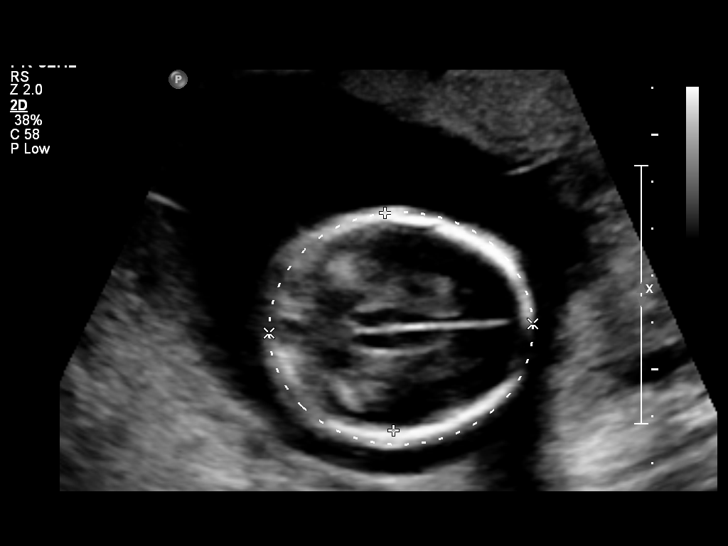
[im 15/55]
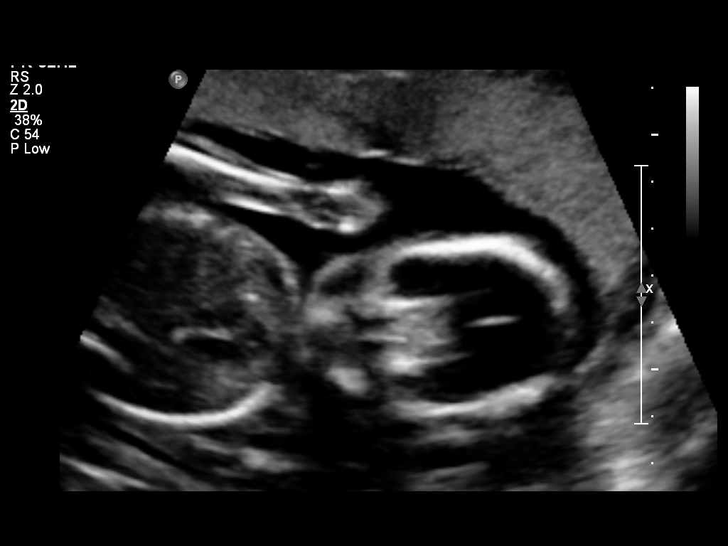
[im 19/55]
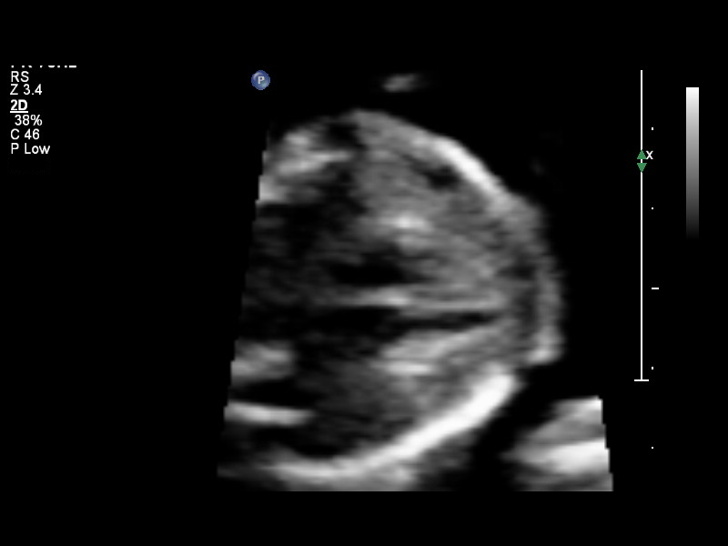
[im 23/55]
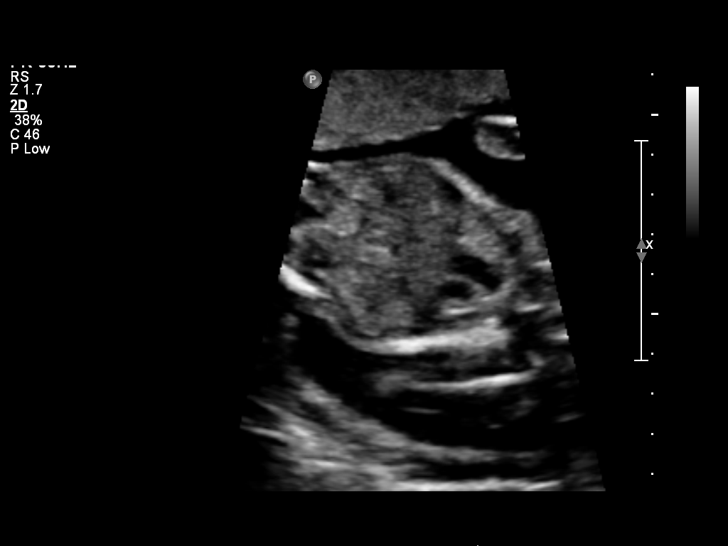
[im 27/55]
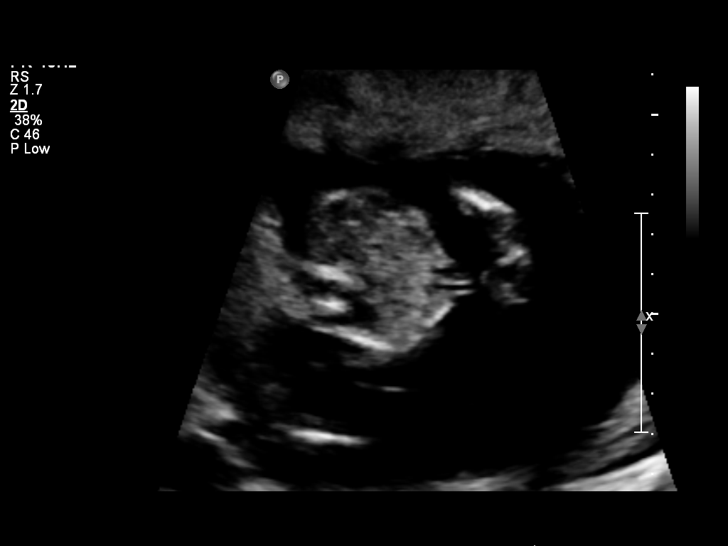
[im 31/55]
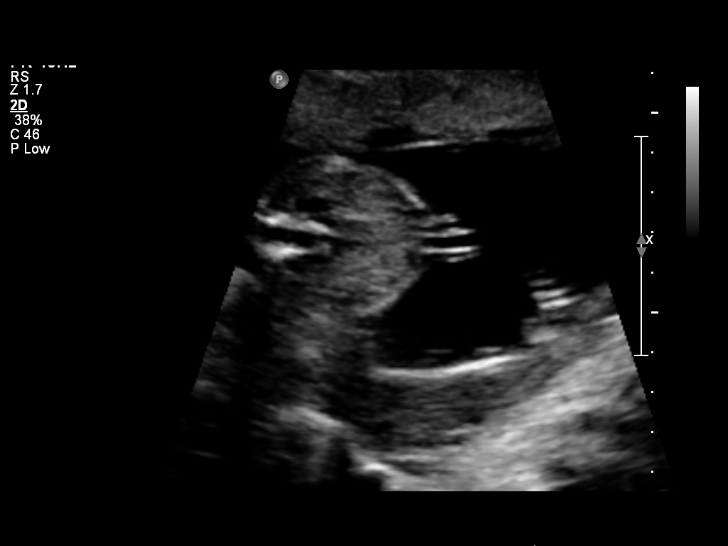
[im 35/55]
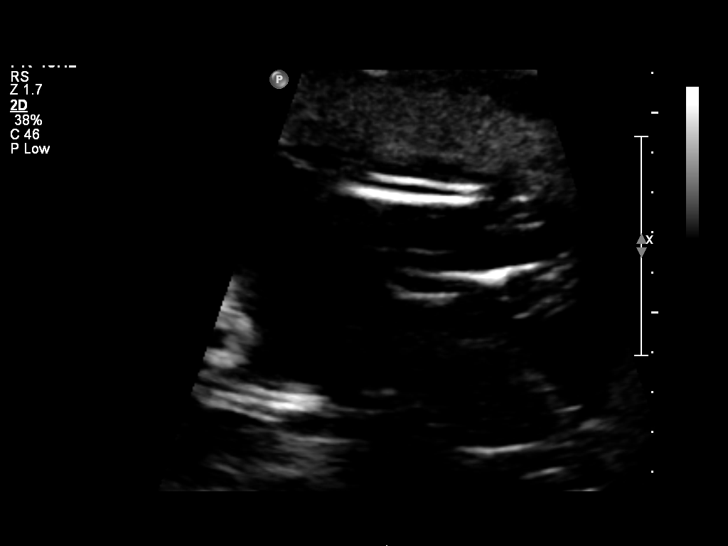
[im 39/55]
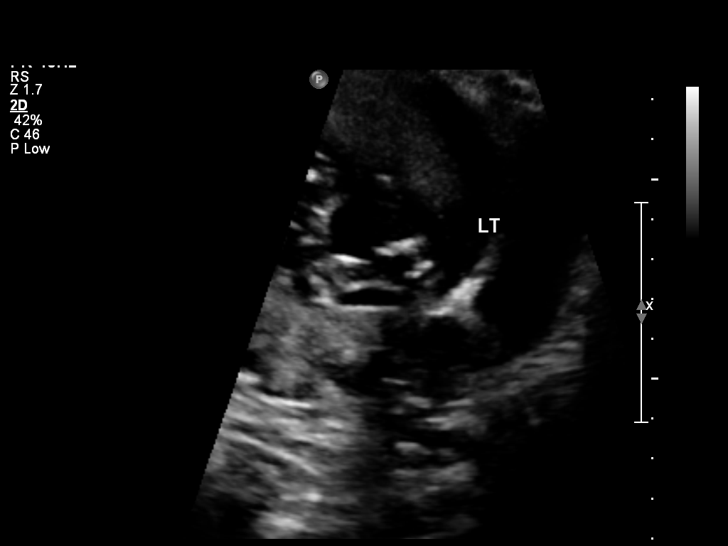
[im 43/55]
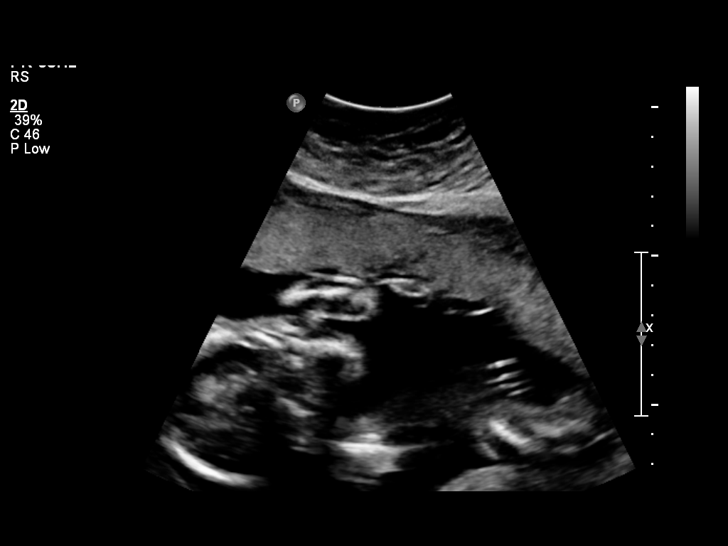
[im 47/55]
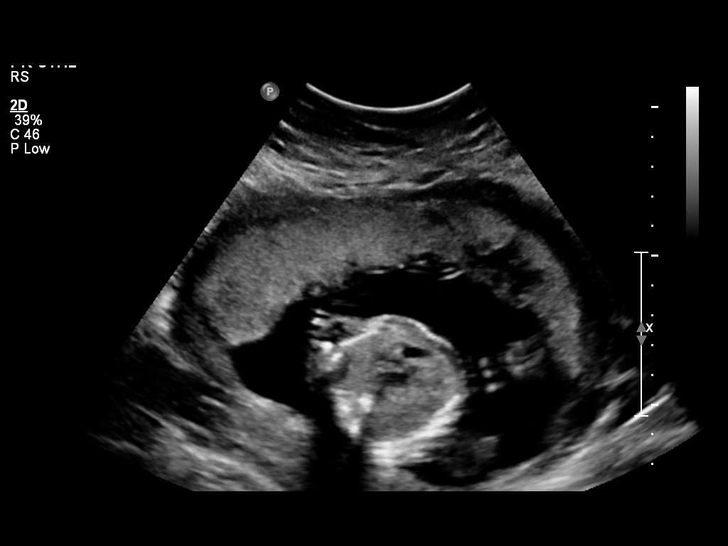
[im 51/55]
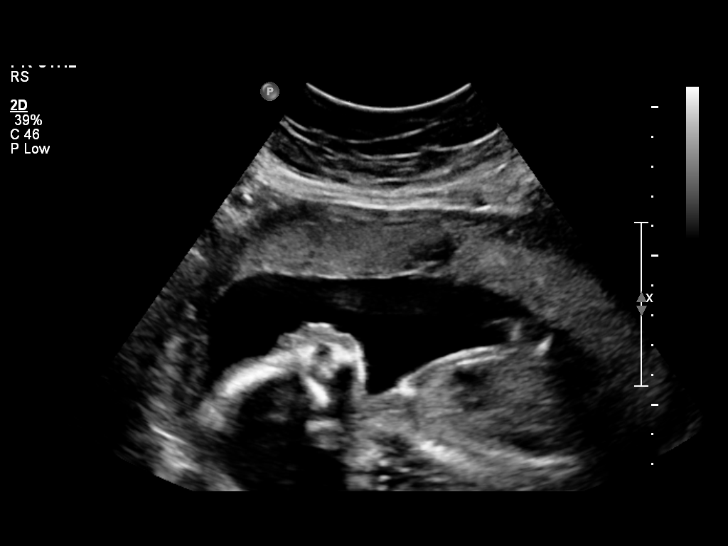
[im 55/55]
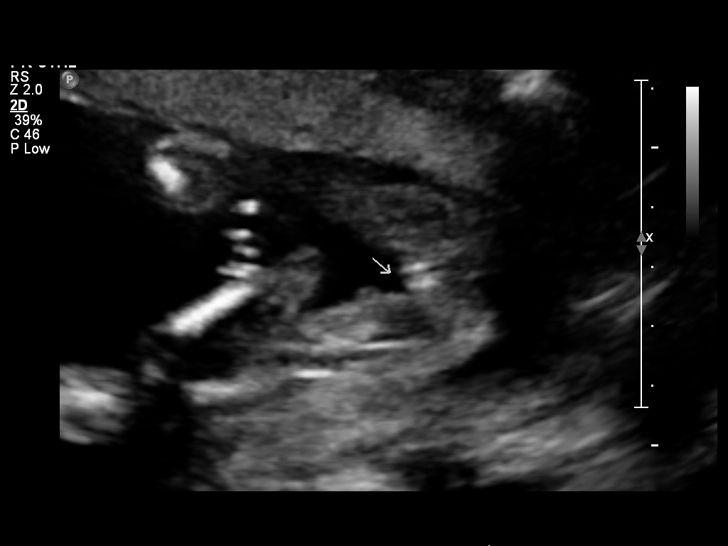

[14 of 28 positions shown; findings below may reference images not displayed]

IMPRESSION: See AS Obstetric US report.

## 2012-01-16 ENCOUNTER — Emergency Department (HOSPITAL_COMMUNITY)
Admission: EM | Admit: 2012-01-16 | Discharge: 2012-01-16 | Disposition: A | Discharge status: Home / Self Care | Payer: Self-pay | Source: Home / Self Care

## 2012-01-16 ENCOUNTER — Encounter (HOSPITAL_COMMUNITY): Payer: Self-pay

## 2012-01-16 DIAGNOSIS — Z3490 Encounter for supervision of normal pregnancy, unspecified, unspecified trimester: Secondary | ICD-10-CM

## 2012-01-16 DIAGNOSIS — Z3201 Encounter for pregnancy test, result positive: Secondary | ICD-10-CM

## 2012-01-16 NOTE — ED Provider Notes (Signed)
History   CSN: [id number]  Arrival date & time 01/16/12  1708  Chief Complaint  Patient presents with  . Abdominal Pain   The history is provided by the patient. No language interpreter was used.   Pt is concerned that she may be pregnant and reported that she has taken a urine pregnancy test at home and it was positive.  She says that she has had 2 miscarriages in the past and is not sure if she is still pregnant or not.  She would like to have a blood pregnancy test done.   She says that she feels in her abdomen that she may be pregnant.  Pt says that she had some nausea 2 weeks ago but has not had any nausea recently and no vomiting.    History reviewed. No pertinent past medical history.  History reviewed. No pertinent past surgical history.  No family history on file.  History  Substance Use Topics  . Smoking status: Not on file  . Smokeless tobacco: Not on file  . Alcohol Use: Not on file    OB History    Grav Para Term Preterm Abortions TAB SAB Ect Mult Living                 Review of Systems  Constitutional: Negative.   HENT: Negative.   Eyes: Negative.   Respiratory: Negative.   Cardiovascular: Negative.   Gastrointestinal: Negative.   Musculoskeletal: Negative.   Neurological: Negative.   Hematological: Negative.   Psychiatric/Behavioral: Negative.     Allergies  Review of patient's allergies indicates no known allergies.  Home Medications  No current outpatient prescriptions on file.  BP 105/61  Pulse 85  Temp 98.5 F (36.9 C) (Oral)  Resp 19  SpO2 100%  LMP 12/09/2011  Physical Exam  Nursing note and vitals reviewed. Constitutional: She is oriented to person, place, and time. She appears well-developed and well-nourished.  HENT:  Head: Normocephalic and atraumatic.  Eyes: EOM are normal. Pupils are equal, round, and reactive to light.  Neck: Normal range of motion. Neck supple.  Cardiovascular: Normal rate, regular rhythm and normal heart  sounds.   Pulmonary/Chest: Effort normal and breath sounds normal.  Abdominal: Soft. Bowel sounds are normal.  Musculoskeletal: Normal range of motion.  Neurological: She is alert and oriented to person, place, and time.  Skin: Skin is warm and dry.  Psychiatric: She has a normal mood and affect. Her behavior is normal. Judgment and thought content normal.    ED Course  Procedures (including critical care time)  Labs Reviewed - No data to display No results found.  No diagnosis found.  MDM  IMPRESSION  Intrauterine pregnancy  RECOMMENDATIONS / PLAN Pt instructed to take her prenatal vitamins and to see her OB asap.  She says that she needed a letter reporting her positive pregnancy test findings for her application for medicaid benefits.  We provided her a letter today. And per her request, we ordered a serum HCG test today as well.    FOLLOW UP With her obstetrician   The patient was given clear instructions to go to ER or return to medical center if symptoms don't improve, worsen or new problems develop.  The patient verbalized understanding.  The patient was told to call to get lab results if they haven't heard anything in the next week.            Cleora Fleet, MD 01/16/12 1850

## 2012-01-16 NOTE — ED Notes (Signed)
Patient states on her last period was having some discomfort and nausea (12/09/11) has not had her period yet this month and stated it is a possibility she may now be pregnant

## 2012-01-17 ENCOUNTER — Telehealth (HOSPITAL_COMMUNITY): Payer: Self-pay

## 2012-01-17 NOTE — Telephone Encounter (Signed)
Message copied by Lestine Mount on Fri Jan 17, 2012  5:42 PM ------      Message from: Cleora Fleet      Created: Fri Jan 17, 2012  9:25 AM      Regarding: Call Patient       Please call patient and let her know that her serum pregnancy test came back positive.  Please have her follow up with her obstetrician asap and take her prenatal vitamins.

## 2012-02-24 ENCOUNTER — Encounter (HOSPITAL_COMMUNITY): Payer: Self-pay

## 2012-02-24 ENCOUNTER — Inpatient Hospital Stay (HOSPITAL_COMMUNITY): Payer: Medicaid Other

## 2012-02-24 ENCOUNTER — Inpatient Hospital Stay (HOSPITAL_COMMUNITY)
Admission: AD | Admit: 2012-02-24 | Discharge: 2012-02-24 | Disposition: A | Discharge status: Home / Self Care | Payer: Medicaid Other | Source: Ambulatory Visit | Attending: Obstetrics & Gynecology | Admitting: Obstetrics & Gynecology

## 2012-02-24 DIAGNOSIS — R109 Unspecified abdominal pain: Secondary | ICD-10-CM | POA: Insufficient documentation

## 2012-02-24 DIAGNOSIS — M549 Dorsalgia, unspecified: Secondary | ICD-10-CM | POA: Insufficient documentation

## 2012-02-24 DIAGNOSIS — O039 Complete or unspecified spontaneous abortion without complication: Secondary | ICD-10-CM | POA: Insufficient documentation

## 2012-02-24 HISTORY — DX: Unspecified abnormal cytological findings in specimens from cervix uteri: R87.619

## 2012-02-24 HISTORY — DX: Reserved for concepts with insufficient information to code with codable children: IMO0002

## 2012-02-24 LAB — URINALYSIS, ROUTINE W REFLEX MICROSCOPIC
Glucose, UA: NEGATIVE mg/dL
Leukocytes, UA: NEGATIVE
Nitrite: NEGATIVE
Protein, ur: NEGATIVE mg/dL

## 2012-02-24 LAB — CBC
HCT: 35.3 % — ABNORMAL LOW (ref 36.0–46.0)
Hemoglobin: 11.5 g/dL — ABNORMAL LOW (ref 12.0–15.0)
MCH: 27.3 pg (ref 26.0–34.0)
MCV: 83.8 fL (ref 78.0–100.0)
Platelets: 190 10*3/uL (ref 150–400)
RBC: 4.21 MIL/uL (ref 3.87–5.11)

## 2012-02-24 LAB — WET PREP, GENITAL

## 2012-02-24 MED ORDER — PROMETHAZINE HCL 12.5 MG PO TABS: 12.5000 mg | ORAL_TABLET | ORAL | Status: AC | PRN

## 2012-02-24 MED ORDER — MISOPROSTOL 200 MCG PO TABS
800.0000 ug | ORAL_TABLET | Freq: Once | ORAL | Status: AC
  Administered 2012-02-24: 800 ug via VAGINAL
  Filled 2012-02-24: qty 4

## 2012-02-24 MED ORDER — HYDROCODONE-ACETAMINOPHEN 5-325 MG PO TABS: 1.0000 | ORAL_TABLET | Freq: Four times a day (QID) | ORAL | Status: DC | PRN

## 2012-02-24 NOTE — MAU Note (Signed)
Patient is in to obtain ultrasound to confirm if she is still pregnant. She c/o back pain. Patient states that she had spotting a week ago but not bleeding now. Patient states,"i want an ultrasound because i know my body and i feel like am having a miscarriage". She states that she had the same symptoms last year when she had a miscarriage.

## 2012-02-24 NOTE — MAU Note (Signed)
Patient states she has had a miscarriage before with the same symptoms and wants an ultrasound to make sure the pregnancy is OK.

## 2012-02-24 NOTE — MAU Note (Signed)
Patient states she has been having back pain on and off for about 2 weeks. Denies any bleeding or abdominal pain at this time. No discharge at this time. Denies nausea or vomiting.

## 2012-02-24 NOTE — MAU Provider Note (Addendum)
History     CSN: [id number]  Arrival date and time: 02/24/12 4098   First Provider Initiated Contact with Patient 02/24/12 [phone]      Chief Complaint  Patient presents with  . Back Pain   HPI  Patient presents with a 3 day history of abdominal/back cramping along with a 2 week history of intermittent spotting.  The patient has has 2 miscarriages in the past (2010-2.66months and 2011-3 months) that have presented in the same manner and is worried this might be the same.  Her last pregnancy was in March 2012 and resulted in a full term delivery.    The patient states that her LMP was [id number].  She had a pregnancy test done in the ED on 19Dec13.  She has had intermittent spotting that began two weeks ago.  She states the discharge has been "pinkish" and sporadic.  She has not had intercourse and no one event preceded the spotting.  The patient also states that she has had cramping for the last 3 days.  The cramping has been intermittent and she rates the pain as a 7/10.  She has taken ibuprofen for the pain (200mg ).  Her last dose of ibuprofen was this morning.  She states that she also has intermittent headaches for which she takes ibuprofen to relieve the pain.    The patient states that she has had vaginal discharge, clear fluid, for the last month.  She reported no pain or bleeding when the clear discharge began.  She has not had any prenatal care but has been taking a multivitamin on her own.  OB History    Grav Para Term Preterm Abortions TAB SAB Ect Mult Living   7 1 1  3 1 2   3       Past Medical History  Diagnosis Date  . Abnormal Pap smear     Past Surgical History  Procedure Date  . Cesarean section   . Colposcopy     History reviewed. No pertinent family history.  History  Substance Use Topics  . Smoking status: Never Smoker   . Smokeless tobacco: Not on file  . Alcohol Use: No    Allergies: No Known Allergies  No prescriptions prior to admission     ROS Physical Exam   Blood pressure 107/65, pulse 74, temperature 97.8 F (36.6 C), temperature source Oral, resp. rate 18, height 5\' 5"  (1.651 m), weight 212 lb 9.6 oz (96.435 kg), last menstrual period 12/09/2011, SpO2 100.00%, unknown if currently breastfeeding.  Physical Exam  Heart: RRR, no extra sounds noted Lungs: clear and equal breath sounds noted in all lobes, no adventitious sounds heard ABD: no masses/rashes noted, soft and non tender in all quadrants, BS+ in all quadrants P/V: 2+ pulses noted in distal extremities  Pelvic: no lesions noted on the external genitalia.  Cervical os is closed with a white, medium physiologic discharge.  Bimanual: no adnexal/cervical motion tenderness  MAU Course  Procedures   Assessment and Plan  See Below  Evalee Mutton 02/24/2012, 9:34 AM   Pt has confirmed failed IUP  by [place].  Discussed options of cytotec, expectant mgmt, and D & E.  All risks, benefits, and alternatives discussed.  Pt decided to cytotec.  Discussed bleeding expectations and when to return to MAU for heavy bleeding.  Also had lengthy discussion with pt about recurrent miscarriage and possible testing after pregnancy resolution.  If D&E becomes necessary, would send products to genetics.  Pt has had a  nml pregnancy after 2 miscarriages.     Discussed patient with Dr. Penne Lash. Cytotec ordered to be inserted vaginally.  Bleeding precautions discussed.  Rx for Vicodin given to patient. Rx for phenergan sent to patient's pharmacy. Patient will be contacted to schedule follow-up in [facility] clinic       Early Intrauterine Pregnancy Failure  _X__  Documented intrauterine pregnancy failure less than or equal to [redacted] weeks gestation  __X_  No serious current illness  _X__  Baseline Hgb greater than or equal to 10g/dl  _X__  Patient has easily accessible transportation to the hospital  _X__  Clear preference  _X__  Practitioner/physician deems patient reliable  _X__   Counseling by practitioner or physician  _X__  Patient education by RN  _X__  Consent form signed  ___  Rho-Gam given by RN if indicated - O pos  __X_ Medication dispensed   _X__   Cytotec 800 mcg  __   Intravaginally by patient at home         _X_   Intravaginally by RN in MAU        __   Rectally by patient at home        __   Rectally by RN in MAU  ___  Ibuprofen 600 mg 1 tablet by mouth every 6 hours as needed #30 - Patient states that she already has ibuprofen at home  _X__  Hydrocodone/acetaminophen 5/325 mg by mouth every 4 to 6 hours as needed  _X__  Phenergan 12.5 mg by mouth every 4 hours as needed for nausea  Freddi Starr, PA-C 02/24/2012 6:08 PM

## 2012-02-25 ENCOUNTER — Encounter (HOSPITAL_COMMUNITY): Payer: Self-pay | Source: Ambulatory Visit

## 2012-02-25 ENCOUNTER — Ambulatory Visit (HOSPITAL_COMMUNITY): Admit: 2012-02-25 | Payer: Self-pay | Source: Ambulatory Visit | Admitting: Obstetrics & Gynecology

## 2012-02-25 SURGERY — DILATION AND EVACUATION, UTERUS
Anesthesia: Choice

## 2012-02-25 NOTE — MAU Provider Note (Signed)
Attestation of Attending Supervision of Advanced Practitioner (CNM/NP): Evaluation and management procedures were performed by the Advanced Practitioner under my supervision and collaboration. I have reviewed the Advanced Practitioner's note and chart, and I agree with the management and plan.  Celestial Barnfield H. 12:13 AM   

## 2012-03-18 ENCOUNTER — Encounter: Payer: Medicaid Other | Admitting: Obstetrics & Gynecology

## 2012-04-10 ENCOUNTER — Encounter: Payer: Self-pay | Admitting: Medical

## 2012-12-29 ENCOUNTER — Encounter (HOSPITAL_COMMUNITY): Payer: Self-pay | Admitting: *Deleted

## 2013-11-29 ENCOUNTER — Encounter (HOSPITAL_COMMUNITY): Payer: Self-pay | Admitting: *Deleted

## 2017-06-29 ENCOUNTER — Encounter (HOSPITAL_COMMUNITY): Payer: Self-pay | Admitting: Emergency Medicine

## 2017-06-29 ENCOUNTER — Ambulatory Visit (HOSPITAL_COMMUNITY)
Admission: EM | Admit: 2017-06-29 | Discharge: 2017-06-29 | Disposition: A | Discharge status: Home / Self Care | Payer: PRIVATE HEALTH INSURANCE | Attending: Family Medicine | Admitting: Family Medicine

## 2017-06-29 DIAGNOSIS — M79641 Pain in right hand: Secondary | ICD-10-CM

## 2017-06-29 DIAGNOSIS — R110 Nausea: Secondary | ICD-10-CM

## 2017-06-29 DIAGNOSIS — M545 Low back pain, unspecified: Secondary | ICD-10-CM

## 2017-06-29 DIAGNOSIS — R21 Rash and other nonspecific skin eruption: Secondary | ICD-10-CM

## 2017-06-29 LAB — POCT URINALYSIS DIP (DEVICE)
Bilirubin Urine: NEGATIVE
Glucose, UA: NEGATIVE mg/dL
HGB URINE DIPSTICK: NEGATIVE
Ketones, ur: NEGATIVE mg/dL
Leukocytes, UA: NEGATIVE
NITRITE: NEGATIVE
PH: 7 (ref 5.0–8.0)
PROTEIN: NEGATIVE mg/dL
Specific Gravity, Urine: 1.025 (ref 1.005–1.030)
UROBILINOGEN UA: 0.2 mg/dL (ref 0.0–1.0)

## 2017-06-29 MED ORDER — METHOCARBAMOL 750 MG PO TABS: 750.0000 mg | ORAL_TABLET | Freq: Four times a day (QID) | ORAL | 0 refills | Status: AC

## 2017-06-29 MED ORDER — MELOXICAM 7.5 MG PO TABS: 7.5000 mg | ORAL_TABLET | Freq: Every day | ORAL | 0 refills | Status: DC

## 2017-06-29 MED ORDER — MICONAZOLE NITRATE 2 % EX CREA: 1.0000 "application " | TOPICAL_CREAM | Freq: Two times a day (BID) | CUTANEOUS | 0 refills | Status: DC

## 2017-06-29 MED ORDER — ONDANSETRON 4 MG PO TBDP: 4.0000 mg | ORAL_TABLET | Freq: Three times a day (TID) | ORAL | 0 refills | Status: DC | PRN

## 2017-06-29 NOTE — Discharge Instructions (Addendum)
Please take Mobic daily to help with back pain, this is an anti-inflammatory  You may also use Robaxin, this is a muscle relaxer  Use Zofran as needed for nausea  Please apply miconazole cream to rash twice daily for 2 weeks

## 2017-06-29 NOTE — ED Triage Notes (Signed)
Pt also c/o R hand pain.

## 2017-06-29 NOTE — ED Provider Notes (Signed)
[facility]    CSN: [id number] Arrival date & time: 06/29/17  1613     History   Chief Complaint Chief Complaint  Patient presents with  . Back Pain  . Nausea  . Hand Pain    HPI Cynthia Zimmerman is a 37 y.o. female no contributing past medical history presenting today for evaluation of multiple complaints.  She is here for evaluation of back pain, nausea, right hand pain, rash.  Her main complaint is the back pain and nausea.  Patient states that she has had back pain for a while, mainly located in her lower back.  Occasionally will radiate onto the left side of her leg.  Denies numbness or tingling.  Denies any specific injury.  Patient works as a Lawyer doing frequent lifting.  Patient also has had some nausea off and on, decreased appetite.  Feels nausea worsens after eating.  Also endorsing constipation.  Takes MiraLAX as needed.  Denies abdominal pain.  Last bowel movement was yesterday.  Denies dysuria and increased frequency.  Denies shortness of breath or chest pain, chest burning.  Also has noticed a spot to her forehead.  Right hand has had some pain and burning sensation.  Concerned if this could be related to an epidural.  HPI  Past Medical History:  Diagnosis Date  . Abnormal Pap smear     Patient Active Problem List   Diagnosis Date Noted  . PREV C/S DELIV DELIV W/WO MENTION ANTPRTM COND 01/26/2009    Past Surgical History:  Procedure Laterality Date  . CESAREAN SECTION    . COLPOSCOPY      OB History    Gravida  7   Para  1   Term  1   Preterm      AB  3   Living  3     SAB  2   TAB  1   Ectopic      Multiple      Live Births               Home Medications    Prior to Admission medications   Medication Sig Start Date End Date Taking? Authorizing Provider  HYDROcodone-acetaminophen (NORCO/VICODIN) 5-325 MG per tablet Take 1 tablet by mouth every 6 (six) hours as needed for pain. 02/24/12   Marny Lowenstein, PA-C  ibuprofen  (ADVIL,MOTRIN) 200 MG tablet Take 200 mg by mouth every 6 (six) hours as needed. Takes for pain    [provider]  meloxicam (MOBIC) 7.5 MG tablet Take 1 tablet (7.5 mg total) by mouth daily. 06/29/17   Yorley Buch C, PA-C  methocarbamol (ROBAXIN-750) 750 MG tablet Take 1 tablet (750 mg total) by mouth 4 (four) times daily for 7 days. 06/29/17 07/06/17  Marciano Mundt C, PA-C  miconazole (MICOTIN) 2 % cream Apply 1 application topically 2 (two) times daily. 06/29/17   Dax Murguia C, PA-C  ondansetron (ZOFRAN ODT) 4 MG disintegrating tablet Take 1 tablet (4 mg total) by mouth every 8 (eight) hours as needed for nausea or vomiting. 06/29/17   Collyn Selk C, PA-C  Prenatal Vit-Fe Fumarate-FA (PRENATAL MULTIVITAMIN) TABS Take 1 tablet by mouth daily.    [provider]  promethazine (PHENERGAN) 12.5 MG tablet Take 1 tablet (12.5 mg total) by mouth every 4 (four) hours as needed for nausea. 02/24/12   Marny Lowenstein, PA-C    Family History No family history on file.  Social History Social History  Tobacco Use  . Smoking status: Never Smoker  Substance Use Topics  . Alcohol use: No  . Drug use: No     Allergies   Patient has no known allergies.   Review of Systems Review of Systems  Constitutional: Positive for appetite change and fatigue. Negative for activity change and fever.  HENT: Negative for mouth sores.   Eyes: Negative for pain and visual disturbance.  Respiratory: Negative for shortness of breath.   Cardiovascular: Negative for chest pain.  Gastrointestinal: Positive for nausea. Negative for abdominal pain and vomiting.  Genitourinary: Negative for dysuria.  Musculoskeletal: Positive for back pain and myalgias. Negative for arthralgias, gait problem, joint swelling, neck pain and neck stiffness.  Skin: Positive for color change and rash. Negative for wound.  Neurological: Negative for dizziness, seizures, syncope, weakness, light-headedness, numbness  and headaches.     Physical Exam Triage Vital Signs ED Triage Vitals [06/29/17 1623]  Enc Vitals Group     BP 115/76     Pulse Rate 87     Resp 18     Temp 98.1 F (36.7 C)     Temp src      SpO2 100 %     Weight      Height      Head Circumference      Peak Flow      Pain Score      Pain Loc      Pain Edu?      Excl. in GC?    No data found.  Updated Vital Signs BP 115/76   Pulse 87   Temp 98.1 F (36.7 C)   Resp 18   SpO2 100%   Visual Acuity Right Eye Distance:   Left Eye Distance:   Bilateral Distance:    Right Eye Near:   Left Eye Near:    Bilateral Near:     Physical Exam  Constitutional: She appears well-developed and well-nourished. No distress.  HENT:  Head: Normocephalic and atraumatic.  Mouth/Throat: Oropharynx is clear and moist.  Eyes: Conjunctivae are normal.  Neck: Neck supple.  Cardiovascular: Normal rate and regular rhythm.  No murmur heard. Pulmonary/Chest: Effort normal and breath sounds normal. No respiratory distress.  Abdominal: Soft. There is no tenderness.  Nontender light deep palpation throughout all 4 quadrants and epigastrium  Musculoskeletal: She exhibits no edema.  Tenderness to palpation throughout lumbar spine midline and paraspinal muscles, nontender to palpation of lateral lumbar musculature.  Negative straight leg raise bilaterally.  Gait without abnormality  Neurological: She is alert.  Skin: Skin is warm and dry.  Small hyperpigmented area to forehead  Psychiatric: She has a normal mood and affect.  Nursing note and vitals reviewed.    UC Treatments / Results  Labs (all labs ordered are listed, but only abnormal results are displayed) Labs Reviewed  POCT URINALYSIS DIP (DEVICE)    EKG None  Radiology No results found.  Procedures Procedures (including critical care time)  Medications Ordered in UC Medications - No data to display  Initial Impression / Assessment and Plan / UC Course  I have  reviewed the triage vital signs and the nursing notes.  Pertinent labs & imaging results that were available during my care of the patient were reviewed by me and considered in my medical decision making (see chart for details).     Back pain likely muscular related to line of work.  Recommending conservative treatment with NSAIDs, provided Mobic.  Robaxin.  UA negative,  pregnancy test negative.  Nausea could be related to constipation or reflux.  Will provide Zofran to use as needed, discussed altering her MiraLAX regimen to have a more regular bowel pattern.Discussed strict return precautions. Patient verbalized understanding and is agreeable with plan.  Final Clinical Impressions(s) / UC Diagnoses   Final diagnoses:  Acute bilateral low back pain without sciatica  Nausea  Right hand pain  Rash and nonspecific skin eruption     Discharge Instructions     Please take Mobic daily to help with back pain, this is an anti-inflammatory  You may also use Robaxin, this is a muscle relaxer  Use Zofran as needed for nausea  Please apply miconazole cream to rash twice daily for 2 weeks   ED Prescriptions    Medication Sig Dispense Auth. Provider   methocarbamol (ROBAXIN-750) 750 MG tablet Take 1 tablet (750 mg total) by mouth 4 (four) times daily for 7 days. 28 tablet Antaeus Karel C, PA-C   meloxicam (MOBIC) 7.5 MG tablet Take 1 tablet (7.5 mg total) by mouth daily. 20 tablet Sivan Quast C, PA-C   ondansetron (ZOFRAN ODT) 4 MG disintegrating tablet Take 1 tablet (4 mg total) by mouth every 8 (eight) hours as needed for nausea or vomiting. 20 tablet Deonta Bomberger C, PA-C   miconazole (MICOTIN) 2 % cream Apply 1 application topically 2 (two) times daily. 28.35 g Saliah Crisp, [place] C, PA-C     Controlled Substance Prescriptions [place] Controlled Substance Registry consulted? Not Applicable   Lew Dawes, [place] 06/29/17 1739

## 2017-09-02 ENCOUNTER — Ambulatory Visit (INDEPENDENT_AMBULATORY_CARE_PROVIDER_SITE_OTHER): Payer: No Typology Code available for payment source | Admitting: Family

## 2017-09-02 ENCOUNTER — Encounter: Payer: Self-pay | Admitting: Family

## 2017-09-02 VITALS — BP 117/79 | HR 80 | Temp 97.9°F | Wt 225.8 lb

## 2017-09-02 DIAGNOSIS — L299 Pruritus, unspecified: Secondary | ICD-10-CM | POA: Diagnosis not present

## 2017-09-02 MED ORDER — HYDROXYZINE HCL 25 MG PO TABS: 25.0000 mg | ORAL_TABLET | Freq: Three times a day (TID) | ORAL | 0 refills | Status: DC | PRN

## 2017-09-02 NOTE — Patient Instructions (Signed)
Nice to meet you.  We will check with dermatologist about getting a skin snip.  Please follow up with the ophthalmologist for a "slit-lamp" evaluation to check for onchoceriasis.   The plan for now will be to continue using the Ivermectin every 3 months until you are symptom free.   Follow up depends on testing and need for further evaluation.

## 2017-09-02 NOTE — Assessment & Plan Note (Addendum)
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 Per history she has been treated in the past for presumed onchocerciasis with resolution of symptoms with one dose of medication only to have return of symptoms. The gold standard for determination would be a skin snip. We do not have the necessary tools here to perform it and will discuss with dermatology. Also recommend a slit-lamp evaluation by ophthalmology to check for microfilaria. I have ordered a peripheral blood smear to check for Loa Loa as Ivermectin could allow Loa Loa into the central nervous system. Recommend continue with Ivermectin pending peripheral smear every 3 months until symptoms resolve could also consider addition of doxycycline for 6 weeks as Ivermectin will kill the larva but not the adults with research showing doxycyline may help kill the adults. Will plan follow up pending results.

## 2017-09-02 NOTE — Progress Notes (Signed)
Subjective:    Patient ID: Cynthia Zimmerman, female    DOB: 1980/12/13, 37 y.o.   MRN: [id number]  Chief Complaint  Patient presents with  . Itching skin    HPI:  Cynthia Zimmerman is a 37 y.o. female who presents today for initial office visit for evaluation and treatment of "itching skin" with concern for oncocerciasis.   Cynthia Zimmerman is a native of [place] having arrived in [place] in 2005. She has been seen by Dermatology with concern for onchocerciasis as she was previously treated in [place] in 2001with a pill for both itching and worms. Following her initial treatment in 2001 her symptoms were resolved. She describes having a skin snip performed and seeing microfloraia. She remained asymptomatic until 2007 when her symptoms of itching restarted. She continues to have generalized itching throughout her body with no specific area worse than the other. There have also been changes in eye sight requiring her to obtain glasses. To her knowledge there has been no slit lamp examination performed. Denies fevers, chills, rashes or lesions. She has taken one dose of Ivermectin about 1 month ago.    No Known Allergies    Outpatient Medications Prior to Visit  Medication Sig Dispense Refill  . ibuprofen (ADVIL,MOTRIN) 200 MG tablet Take 200 mg by mouth every 6 (six) hours as needed. Takes for pain    . miconazole (MICOTIN) 2 % cream Apply 1 application topically 2 (two) times daily. 28.35 g 0  . Prenatal Vit-Fe Fumarate-FA (PRENATAL MULTIVITAMIN) TABS Take 1 tablet by mouth daily.    . promethazine (PHENERGAN) 12.5 MG tablet Take 1 tablet (12.5 mg total) by mouth every 4 (four) hours as needed for nausea. 30 tablet 0  . HYDROcodone-acetaminophen (NORCO/VICODIN) 5-325 MG per tablet Take 1 tablet by mouth every 6 (six) hours as needed for pain. (Patient not taking: Reported on 09/02/2017) 30 tablet 0  . meloxicam (MOBIC) 7.5 MG tablet Take 1 tablet (7.5 mg total) by mouth daily. (Patient not  taking: Reported on 09/02/2017) 20 tablet 0  . ondansetron (ZOFRAN ODT) 4 MG disintegrating tablet Take 1 tablet (4 mg total) by mouth every 8 (eight) hours as needed for nausea or vomiting. (Patient not taking: Reported on 09/02/2017) 20 tablet 0   No facility-administered medications prior to visit.      Past Medical History:  Diagnosis Date  . Abnormal Pap smear      Past Surgical History:  Procedure Laterality Date  . CESAREAN SECTION    . COLPOSCOPY      Review of Systems  Constitutional: Negative for chills, diaphoresis, fatigue, fever and unexpected weight change.  Eyes: Negative for photophobia, pain, discharge, redness, itching and visual disturbance.  Respiratory: Negative for chest tightness and shortness of breath.   Cardiovascular: Negative for chest pain and leg swelling.  Skin: Negative for color change, rash and wound.       Positive for itching.       Objective:    BP 117/79   Pulse 80   Temp 97.9 F (36.6 C)   Wt 225 lb 12.8 oz (102.4 kg)   LMP 08/02/2017 (Approximate)   BMI 37.58 kg/m  Nursing note and vital signs reviewed.  Physical Exam  Constitutional: She is oriented to person, place, and time. She appears well-developed and well-nourished. No distress.  Cardiovascular: Normal rate, regular rhythm, normal heart sounds and intact distal pulses.  Pulmonary/Chest: Effort normal and breath sounds normal.  Neurological: She is alert  and oriented to person, place, and time.  Skin: Skin is warm and dry.  Psychiatric: She has a normal mood and affect. Her behavior is normal. Judgment and thought content normal.       Assessment & Plan:   Problem List Items Addressed This Visit      Musculoskeletal and Integument   Itchy skin - Primary    Ms. 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 Per history she has been treated in the past for presumed onchocerciasis with resolution of symptoms with one  dose of medication only to have return of symptoms. The gold standard for determination would be a skin snip. We do not have the necessary tools here to perform it and will discuss with dermatology. Also recommend a slit-lamp evaluation by ophthalmology to check for microfilaria. I have ordered a peripheral blood smear to check for Loa Loa as Ivermectin could allow Loa Loa into the central nervous system. Recommend continue with Ivermectin pending peripheral smear every 3 months until symptoms resolve could also consider addition of doxycycline for 6 weeks as Ivermectin will kill the larva but not the adults with research showing doxycyline may help kill the adults. Will plan follow up pending results.       Relevant Orders   CBC (INCLUDES DIFF/PLT) WITH PATHOLOGIST REVIEW       I am having Havannah Streat. Henneke start on hydrOXYzine. I am also having her maintain her prenatal multivitamin, ibuprofen, HYDROcodone-acetaminophen, promethazine, meloxicam, ondansetron, and miconazole.   Meds ordered this encounter  Medications  . hydrOXYzine (ATARAX/VISTARIL) 25 MG tablet    Sig: Take 1 tablet (25 mg total) by mouth 3 (three) times daily as needed for itching.    Dispense:  30 tablet    Refill:  0    Order Specific Question:   Supervising Provider    Answer:   Judyann Munson [[id number]]     Follow-up: Pending blood work and biopsy.   Marcos Eke, MSN, FNP-C Nurse Practitioner [facility] for Infectious Disease [facility] Office phone: [phone] Pager: [phone] [facility] Main number: [phone]

## 2017-09-03 LAB — PATHOLOGIST SMEAR REVIEW

## 2017-09-05 ENCOUNTER — Telehealth: Payer: Self-pay | Admitting: *Deleted

## 2017-09-05 NOTE — Telephone Encounter (Signed)
Relayed results to patient. She would like an update regarding dermatologist "skin snip" test, specifically whether or not Dr Charlton HawsMcConnell can do this test. She has an eye appointment scheduled in September. She will need pills for next month. Andree CossHowell, Laporscha Linehan M, RN

## 2017-09-05 NOTE — Telephone Encounter (Signed)
-----   Message from Veryl SpeakGregory D Calone, FNP sent at 09/04/2017 10:50 AM EDT ----- Please inform patient that there were no microfilaria in her blood so it is safe to continue with the ivermectin in the coming 2 months. Follow up pending her ophthalmology or dermatology appointment.

## 2017-12-04 ENCOUNTER — Telehealth: Payer: Self-pay | Admitting: Family

## 2017-12-04 NOTE — Telephone Encounter (Signed)
Patient is wanting to know if she can have a refill called in of hydroxyzine.

## 2017-12-04 NOTE — Telephone Encounter (Signed)
Patient would like a hydroxyzine refill for itching.  Is this refill appropriate? Angeline Slim RN

## 2017-12-12 ENCOUNTER — Other Ambulatory Visit: Payer: Self-pay | Admitting: *Deleted

## 2017-12-12 DIAGNOSIS — L299 Pruritus, unspecified: Secondary | ICD-10-CM

## 2017-12-12 MED ORDER — HYDROXYZINE HCL 25 MG PO TABS: 25.0000 mg | ORAL_TABLET | Freq: Three times a day (TID) | ORAL | 2 refills | Status: DC | PRN

## 2017-12-12 NOTE — Telephone Encounter (Signed)
Refill sent to pharmacy on file and call to patient with message that it has been done.

## 2017-12-12 NOTE — Telephone Encounter (Signed)
Patient calling back to see if she can get the refill she requested last week.

## 2017-12-12 NOTE — Telephone Encounter (Signed)
Ok to refill 

## 2018-09-14 ENCOUNTER — Other Ambulatory Visit: Payer: Self-pay | Admitting: Family

## 2018-09-14 DIAGNOSIS — L299 Pruritus, unspecified: Secondary | ICD-10-CM

## 2019-07-21 ENCOUNTER — Other Ambulatory Visit: Payer: Self-pay | Admitting: Family

## 2019-07-21 ENCOUNTER — Telehealth: Payer: Self-pay

## 2019-07-21 DIAGNOSIS — L299 Pruritus, unspecified: Secondary | ICD-10-CM

## 2019-07-21 NOTE — Telephone Encounter (Signed)
Received refill request for : hydrOXYzine (ATARAX/VISTARIL) 25 MG tablet. Will forward to provider if okay to refill. Lorenso Courier, [place]

## 2019-07-22 NOTE — Telephone Encounter (Signed)
Spoke with FNP who request refill be denied. If patient is still experiencing itchy skin must follow up with PCP. Lorenso Courier, [place]

## 2019-08-25 ENCOUNTER — Other Ambulatory Visit: Payer: Self-pay | Admitting: Internal Medicine

## 2019-08-25 ENCOUNTER — Other Ambulatory Visit (HOSPITAL_COMMUNITY)
Admission: RE | Admit: 2019-08-25 | Discharge: 2019-08-25 | Disposition: A | Discharge status: Home / Self Care | Payer: 59 | Source: Ambulatory Visit | Attending: Internal Medicine | Admitting: Internal Medicine

## 2019-08-25 DIAGNOSIS — Z124 Encounter for screening for malignant neoplasm of cervix: Secondary | ICD-10-CM | POA: Insufficient documentation

## 2019-08-30 LAB — CYTOLOGY - PAP
Adequacy: ABSENT
Comment: NEGATIVE
Diagnosis: NEGATIVE
High risk HPV: NEGATIVE

## 2019-09-10 ENCOUNTER — Telehealth (HOSPITAL_COMMUNITY): Payer: Self-pay

## 2019-09-30 ENCOUNTER — Ambulatory Visit (HOSPITAL_COMMUNITY)
Admission: EM | Admit: 2019-09-30 | Discharge: 2019-09-30 | Disposition: A | Discharge status: Home / Self Care | Payer: 59 | Attending: Family Medicine | Admitting: Family Medicine

## 2019-09-30 ENCOUNTER — Other Ambulatory Visit: Payer: Self-pay

## 2019-09-30 ENCOUNTER — Encounter (HOSPITAL_COMMUNITY): Payer: Self-pay | Admitting: Emergency Medicine

## 2019-09-30 DIAGNOSIS — S39012A Strain of muscle, fascia and tendon of lower back, initial encounter: Secondary | ICD-10-CM | POA: Diagnosis not present

## 2019-09-30 DIAGNOSIS — S161XXA Strain of muscle, fascia and tendon at neck level, initial encounter: Secondary | ICD-10-CM

## 2019-09-30 MED ORDER — DICLOFENAC SODIUM 75 MG PO TBEC: 75.0000 mg | DELAYED_RELEASE_TABLET | Freq: Two times a day (BID) | ORAL | 0 refills | Status: AC

## 2019-09-30 MED ORDER — CYCLOBENZAPRINE HCL 10 MG PO TABS: ORAL_TABLET | ORAL | 0 refills | Status: DC

## 2019-09-30 NOTE — Discharge Instructions (Signed)
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
## 2019-09-30 NOTE — ED Notes (Signed)
Called pt x1 no answer

## 2019-09-30 NOTE — ED Notes (Signed)
Called pt x2 no answer

## 2019-10-07 ENCOUNTER — Telehealth (HOSPITAL_COMMUNITY): Payer: Self-pay | Admitting: Family Medicine

## 2019-10-07 NOTE — Telephone Encounter (Signed)
Patient return to work note needs to indicate without restrictions.

## 2019-11-05 ENCOUNTER — Other Ambulatory Visit: Payer: Self-pay | Admitting: Family

## 2019-11-05 DIAGNOSIS — L299 Pruritus, unspecified: Secondary | ICD-10-CM

## 2020-04-17 ENCOUNTER — Ambulatory Visit (HOSPITAL_COMMUNITY): Admission: EM | Admit: 2020-04-17 | Discharge: 2020-04-17 | Discharge status: Against Medical Advice | Payer: 59

## 2020-04-17 ENCOUNTER — Emergency Department (HOSPITAL_COMMUNITY)
Admission: EM | Admit: 2020-04-17 | Discharge: 2020-04-17 | Disposition: A | Discharge status: Home / Self Care | Payer: 59 | Attending: Emergency Medicine | Admitting: Emergency Medicine

## 2020-04-17 ENCOUNTER — Encounter (HOSPITAL_COMMUNITY): Payer: Self-pay | Admitting: Emergency Medicine

## 2020-04-17 ENCOUNTER — Other Ambulatory Visit: Payer: Self-pay

## 2020-04-17 DIAGNOSIS — M5432 Sciatica, left side: Secondary | ICD-10-CM

## 2020-04-17 DIAGNOSIS — M545 Low back pain, unspecified: Secondary | ICD-10-CM | POA: Diagnosis present

## 2020-04-17 DIAGNOSIS — M5442 Lumbago with sciatica, left side: Secondary | ICD-10-CM | POA: Insufficient documentation

## 2020-04-17 MED ORDER — KETOROLAC TROMETHAMINE 30 MG/ML IJ SOLN
30.0000 mg | Freq: Once | INTRAMUSCULAR | Status: AC
  Administered 2020-04-17: 30 mg via INTRAMUSCULAR
  Filled 2020-04-17: qty 1

## 2020-04-17 MED ORDER — METHOCARBAMOL 500 MG PO TABS: 500.0000 mg | ORAL_TABLET | Freq: Two times a day (BID) | ORAL | 0 refills | Status: DC

## 2020-04-17 MED ORDER — METHOCARBAMOL 500 MG PO TABS
500.0000 mg | ORAL_TABLET | Freq: Once | ORAL | Status: AC
  Administered 2020-04-17: 500 mg via ORAL
  Filled 2020-04-17: qty 1
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
## 2020-04-17 NOTE — ED Notes (Signed)
Pt ambulated well to the bed.  

## 2020-04-17 NOTE — ED Notes (Addendum)
Patient Alert and oriented to baseline. Stable and ambulatory to baseline. Patient verbalized understanding of the discharge instructions.  Patient belongings were taken by the patient.   

## 2020-04-17 NOTE — Discharge Instructions (Addendum)
Please follow-up with your PCP.  I also prescribed you a muscle relaxant which is the same muscle relaxant that you got here today in the emergency department.  Please take this as directed.  Use the attached instructions on sports rehab for sciatica.  If you have any new or worsening concerning symptom please come into the emergency room.  Please speak to your pharmacist about any new medications prescribed today in regards to side effects or interactions with other medications.  Get help right away if: You develop new bowel or bladder control problems. You have unusual weakness or numbness in your arms or legs. You develop nausea or vomiting. You develop abdominal pain. You feel faint.

## 2020-06-12 ENCOUNTER — Other Ambulatory Visit: Payer: Self-pay | Admitting: Internal Medicine

## 2020-06-12 DIAGNOSIS — M5441 Lumbago with sciatica, right side: Secondary | ICD-10-CM

## 2020-06-21 ENCOUNTER — Other Ambulatory Visit: Payer: No Typology Code available for payment source

## 2020-06-27 ENCOUNTER — Other Ambulatory Visit: Payer: No Typology Code available for payment source

## 2020-12-14 ENCOUNTER — Encounter (HOSPITAL_COMMUNITY): Payer: Self-pay | Admitting: Emergency Medicine

## 2020-12-14 ENCOUNTER — Other Ambulatory Visit: Payer: Self-pay

## 2020-12-14 ENCOUNTER — Ambulatory Visit (HOSPITAL_COMMUNITY)
Admission: EM | Admit: 2020-12-14 | Discharge: 2020-12-14 | Disposition: A | Discharge status: Home / Self Care | Payer: 59 | Attending: Internal Medicine | Admitting: Internal Medicine

## 2020-12-14 DIAGNOSIS — R059 Cough, unspecified: Secondary | ICD-10-CM | POA: Diagnosis not present

## 2020-12-14 DIAGNOSIS — J069 Acute upper respiratory infection, unspecified: Secondary | ICD-10-CM | POA: Insufficient documentation

## 2020-12-14 DIAGNOSIS — Y92410 Unspecified street and highway as the place of occurrence of the external cause: Secondary | ICD-10-CM | POA: Insufficient documentation

## 2020-12-14 DIAGNOSIS — M5440 Lumbago with sciatica, unspecified side: Secondary | ICD-10-CM | POA: Diagnosis not present

## 2020-12-14 DIAGNOSIS — R200 Anesthesia of skin: Secondary | ICD-10-CM | POA: Diagnosis not present

## 2020-12-14 DIAGNOSIS — M549 Dorsalgia, unspecified: Secondary | ICD-10-CM | POA: Diagnosis present

## 2020-12-14 DIAGNOSIS — J029 Acute pharyngitis, unspecified: Secondary | ICD-10-CM | POA: Insufficient documentation

## 2020-12-14 DIAGNOSIS — S3992XS Unspecified injury of lower back, sequela: Secondary | ICD-10-CM | POA: Insufficient documentation

## 2020-12-14 DIAGNOSIS — G8929 Other chronic pain: Secondary | ICD-10-CM | POA: Diagnosis not present

## 2020-12-14 DIAGNOSIS — Z20822 Contact with and (suspected) exposure to covid-19: Secondary | ICD-10-CM | POA: Insufficient documentation
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
## 2020-12-14 NOTE — Discharge Instructions (Addendum)
Please go back to the specialist managing your back pain Follow-up with a physical therapist for back exercises We will call you with recommendations if labs are abnormal.
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
## 2020-12-15 LAB — SARS CORONAVIRUS 2 (TAT 6-24 HRS): SARS Coronavirus 2: NEGATIVE

## 2021-02-02 ENCOUNTER — Encounter (HOSPITAL_COMMUNITY): Payer: Self-pay | Admitting: Emergency Medicine

## 2021-02-02 ENCOUNTER — Other Ambulatory Visit: Payer: Self-pay

## 2021-02-02 ENCOUNTER — Ambulatory Visit (HOSPITAL_COMMUNITY)
Admission: EM | Admit: 2021-02-02 | Discharge: 2021-02-02 | Disposition: A | Discharge status: Home / Self Care | Payer: 59 | Attending: Family Medicine | Admitting: Family Medicine

## 2021-02-02 DIAGNOSIS — R052 Subacute cough: Secondary | ICD-10-CM | POA: Diagnosis present

## 2021-02-02 DIAGNOSIS — U071 COVID-19: Secondary | ICD-10-CM | POA: Diagnosis not present

## 2021-02-02 DIAGNOSIS — J111 Influenza due to unidentified influenza virus with other respiratory manifestations: Secondary | ICD-10-CM | POA: Diagnosis present

## 2021-02-02 DIAGNOSIS — R52 Pain, unspecified: Secondary | ICD-10-CM

## 2021-02-02 LAB — POC INFLUENZA A AND B ANTIGEN (URGENT CARE ONLY)
INFLUENZA A ANTIGEN, POC: NEGATIVE
INFLUENZA B ANTIGEN, POC: NEGATIVE

## 2021-02-02 MED ORDER — PROMETHAZINE-DM 6.25-15 MG/5ML PO SYRP: 5.0000 mL | ORAL_SOLUTION | Freq: Four times a day (QID) | ORAL | 0 refills | Status: AC | PRN

## 2021-02-02 NOTE — ED Provider Notes (Signed)
[facility]    CSN: [id number] Arrival date & time: 02/02/21  0816      History   Chief Complaint Chief Complaint  Patient presents with   Cough   Sore Throat    HPI Cynthia Zimmerman is a 41 y.o. female.   Patient is here for uri symptoms.  Started with cough,  sore throat, runny nose, weakness, watery eyes, body aches that started yesterday.  She could not sleep last night.  Symptoms have continued today;   she does have a fever today, she did take tylenol. + sick contacts She did get the flu shot;    Past Medical History:  Diagnosis Date   Abnormal Pap smear     Patient Active Problem List   Diagnosis Date Noted   Itchy skin 09/02/2017   PREV C/S DELIV DELIV W/WO MENTION ANTPRTM COND 01/26/2009    Past Surgical History:  Procedure Laterality Date   CESAREAN SECTION     COLPOSCOPY      OB History     Gravida  7   Para  1   Term  1   Preterm      AB  3   Living  3      SAB  2   IAB  1   Ectopic      Multiple      Live Births               Home Medications    Prior to Admission medications   Medication Sig Start Date End Date Taking? Authorizing Provider  diclofenac (VOLTAREN) 75 MG EC tablet Take 1 tablet (75 mg total) by mouth 2 (two) times daily. 09/30/19   Vanessa Kick, MD  hydrOXYzine (ATARAX/VISTARIL) 25 MG tablet TAKE 1 TABLET(25 MG) BY MOUTH THREE TIMES DAILY AS NEEDED FOR ITCHING 09/14/18   Golden Circle, FNP  ibuprofen (ADVIL,MOTRIN) 200 MG tablet Take 200 mg by mouth every 6 (six) hours as needed. Takes for pain    [provider]  methocarbamol (ROBAXIN) 500 MG tablet Take 1 tablet (500 mg total) by mouth 2 (two) times daily. 04/17/20   Alfredia Client, PA-C  miconazole (MICOTIN) 2 % cream Apply 1 application topically 2 (two) times daily. 06/29/17   Wieters, Hallie C, PA-C  Prenatal Vit-Fe Fumarate-FA (PRENATAL MULTIVITAMIN) TABS Take 1 tablet by mouth daily.    [provider]  promethazine  (PHENERGAN) 12.5 MG tablet Take 1 tablet (12.5 mg total) by mouth every 4 (four) hours as needed for nausea. 02/24/12   Luvenia Redden, PA-C    Family History Family History  Problem Relation Age of Onset   Healthy Mother    Healthy Father     Social History Social History   Tobacco Use   Smoking status: Never   Smokeless tobacco: Never  Substance Use Topics   Alcohol use: No   Drug use: No     Allergies   Patient has no known allergies.   Review of Systems Review of Systems  Constitutional:  Positive for activity change, chills, fatigue and fever.  HENT:  Positive for congestion, rhinorrhea and sore throat.   Respiratory:  Positive for cough.   Cardiovascular: Negative.   Gastrointestinal:  Positive for nausea. Negative for vomiting.  Musculoskeletal:  Positive for arthralgias and myalgias.    Physical Exam Triage Vital Signs ED Triage Vitals  Enc Vitals Group     BP 02/02/21 0903 (!) 144/93  Pulse Rate 02/02/21 0903 100     Resp 02/02/21 0903 20     Temp 02/02/21 0903 100 F (37.8 C)     Temp Source 02/02/21 0903 Oral     SpO2 02/02/21 0903 98 %     Weight --      Height --      Head Circumference --      Peak Flow --      Pain Score 02/02/21 0901 9     Pain Loc --      Pain Edu? --      Excl. in [place]? --    No data found.  Updated Vital Signs BP (!) 144/93 (BP Location: Right Arm)    Pulse 100    Temp 100 F (37.8 C) (Oral)    Resp 20    SpO2 98%   Visual Acuity Right Eye Distance:   Left Eye Distance:   Bilateral Distance:    Right Eye Near:   Left Eye Near:    Bilateral Near:     Physical Exam Constitutional:      General: She is not in acute distress.    Appearance: She is well-developed. She is ill-appearing.  HENT:     Head: Normocephalic and atraumatic.     Right Ear: Tympanic membrane and ear canal normal.     Left Ear: Tympanic membrane and ear canal normal.     Mouth/Throat:     Mouth: Mucous membranes are moist.      Pharynx: Oropharynx is clear. Posterior oropharyngeal erythema present. No pharyngeal swelling or oropharyngeal exudate.     Tonsils: No tonsillar exudate or tonsillar abscesses.  Cardiovascular:     Rate and Rhythm: Normal rate and regular rhythm.     Heart sounds: Normal heart sounds.  Pulmonary:     Effort: Pulmonary effort is normal.     Breath sounds: Normal breath sounds.  Abdominal:     General: Bowel sounds are normal.     Palpations: Abdomen is soft.  Musculoskeletal:     Cervical back: Normal range of motion and neck supple.  Neurological:     Mental Status: She is alert.     UC Treatments / Results  Labs (all labs ordered are listed, but only abnormal results are displayed) Labs Reviewed  SARS CORONAVIRUS 2 (TAT 6-24 HRS)  POC INFLUENZA A AND B ANTIGEN (URGENT CARE ONLY)    EKG   Radiology No results found.  Procedures Procedures (including critical care time)  Medications Ordered in UC Medications - No data to display  Initial Impression / Assessment and Plan / UC Course  I have reviewed the triage vital signs and the nursing notes.  Pertinent labs & imaging results that were available during my care of the patient were reviewed by me and considered in my medical decision making (see chart for details).   Patient likely with flu like illness.  Flu negative, covid pending.  Cough syrup given, and recommend supportive care at home.  Recommended follow up if not improving.   Final Clinical Impressions(s) / UC Diagnoses   Final diagnoses:  Influenza-like illness  Subacute cough  Body aches     Discharge Instructions      You have been diagnosed with a flu like illness.  Your flu swab  was negative today.  We have swabbed you for covid and this will be resulted tomorrow.  I recommend you go home and get plenty of rest, fluids, and take tylenol  and motirn for pain, fever and body aches.  If you are worsening then please follow up.     ED  Prescriptions     Medication Sig Dispense Auth. Provider   promethazine-dextromethorphan (PROMETHAZINE-DM) 6.25-15 MG/5ML syrup Take 5 mLs by mouth 4 (four) times daily as needed for cough. 118 mL Rondel Oh, MD      PDMP not reviewed this encounter.   Rondel Oh, MD 02/02/21 [phone]

## 2021-02-02 NOTE — ED Triage Notes (Signed)
Pt coughing, sore throat, headache, body aches, congestion for a week.

## 2021-02-02 NOTE — Discharge Instructions (Signed)
You have been diagnosed with a flu like illness.  Your flu swab  was negative today.  We have swabbed you for covid and this will be resulted tomorrow.  I recommend you go home and get plenty of rest, fluids, and take tylenol and motirn for pain, fever and body aches.  If you are worsening then please follow up.

## 2021-02-03 LAB — SARS CORONAVIRUS 2 (TAT 6-24 HRS): SARS Coronavirus 2: POSITIVE — AB

## 2021-08-25 ENCOUNTER — Ambulatory Visit (HOSPITAL_COMMUNITY)
Admission: EM | Admit: 2021-08-25 | Discharge: 2021-08-25 | Disposition: A | Discharge status: Home / Self Care | Payer: 59 | Attending: Physician Assistant | Admitting: Physician Assistant

## 2021-08-25 ENCOUNTER — Encounter (HOSPITAL_COMMUNITY): Payer: Self-pay

## 2021-08-25 DIAGNOSIS — M5442 Lumbago with sciatica, left side: Secondary | ICD-10-CM | POA: Diagnosis not present

## 2021-08-25 DIAGNOSIS — L299 Pruritus, unspecified: Secondary | ICD-10-CM

## 2021-08-25 HISTORY — DX: Dorsalgia, unspecified: M54.9

## 2021-08-25 MED ORDER — PREDNISONE 10 MG (21) PO TBPK: ORAL_TABLET | Freq: Every day | ORAL | 0 refills | Status: DC

## 2021-08-25 MED ORDER — HYDROXYZINE HCL 25 MG PO TABS: ORAL_TABLET | ORAL | 2 refills | Status: DC

## 2021-08-25 NOTE — Discharge Instructions (Addendum)
Take prednisone as prescribed Discontinue Advil and other NSAIDS while taking prednisone Can take Tylenol as needed Follow up with spine surgeon or neurosurgeon
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
## 2021-12-09 ENCOUNTER — Ambulatory Visit (HOSPITAL_COMMUNITY)
Admission: EM | Admit: 2021-12-09 | Discharge: 2021-12-09 | Disposition: A | Discharge status: Home / Self Care | Payer: 59 | Attending: Internal Medicine | Admitting: Internal Medicine

## 2021-12-09 ENCOUNTER — Encounter (HOSPITAL_COMMUNITY): Payer: Self-pay | Admitting: Emergency Medicine

## 2021-12-09 ENCOUNTER — Other Ambulatory Visit: Payer: Self-pay

## 2021-12-09 DIAGNOSIS — E860 Dehydration: Secondary | ICD-10-CM | POA: Diagnosis not present

## 2021-12-09 DIAGNOSIS — Z1152 Encounter for screening for COVID-19: Secondary | ICD-10-CM | POA: Diagnosis not present

## 2021-12-09 DIAGNOSIS — R110 Nausea: Secondary | ICD-10-CM | POA: Diagnosis not present

## 2021-12-09 DIAGNOSIS — L299 Pruritus, unspecified: Secondary | ICD-10-CM | POA: Diagnosis present

## 2021-12-09 DIAGNOSIS — J111 Influenza due to unidentified influenza virus with other respiratory manifestations: Secondary | ICD-10-CM | POA: Insufficient documentation

## 2021-12-09 DIAGNOSIS — R509 Fever, unspecified: Secondary | ICD-10-CM | POA: Insufficient documentation

## 2021-12-09 LAB — RESP PANEL BY RT-PCR (FLU A&B, COVID) ARPGX2
Influenza A by PCR: NEGATIVE
Influenza B by PCR: NEGATIVE
SARS Coronavirus 2 by RT PCR: NEGATIVE

## 2021-12-09 MED ORDER — ACETAMINOPHEN 325 MG PO TABS
975.0000 mg | ORAL_TABLET | Freq: Once | ORAL | Status: AC
  Administered 2021-12-09: 975 mg via ORAL

## 2021-12-09 MED ORDER — KETOROLAC TROMETHAMINE 30 MG/ML IJ SOLN
INTRAMUSCULAR | Status: AC
Start: 2021-12-09 — End: ?
  Filled 2021-12-09: qty 1

## 2021-12-09 MED ORDER — ACETAMINOPHEN 325 MG PO TABS
ORAL_TABLET | ORAL | Status: AC
  Filled 2021-12-09: qty 3

## 2021-12-09 MED ORDER — ONDANSETRON 4 MG PO TBDP
4.0000 mg | ORAL_TABLET | Freq: Once | ORAL | Status: AC
Start: 2021-12-09 — End: 2021-12-09
  Administered 2021-12-09: 4 mg via ORAL

## 2021-12-09 MED ORDER — KETOROLAC TROMETHAMINE 30 MG/ML IJ SOLN
30.0000 mg | Freq: Once | INTRAMUSCULAR | Status: AC
  Administered 2021-12-09: 30 mg via INTRAMUSCULAR

## 2021-12-09 MED ORDER — ONDANSETRON 4 MG PO TBDP: 4.0000 mg | ORAL_TABLET | Freq: Three times a day (TID) | ORAL | 0 refills | Status: DC | PRN

## 2021-12-09 MED ORDER — ONDANSETRON 4 MG PO TBDP
ORAL_TABLET | ORAL | Status: AC
  Filled 2021-12-09: qty 1

## 2021-12-09 MED ORDER — HYDROXYZINE HCL 25 MG PO TABS: ORAL_TABLET | ORAL | 0 refills | Status: DC

## 2021-12-09 NOTE — Discharge Instructions (Addendum)
You have a viral upper respiratory infection.  COVID-19 and flu testing is pending. We will call you with results if positive. If your COVID test is positive, you must stay at home until day 6 of symptoms. On day 6, you may go out into public and go back to work, but you must wear a mask until day 11 of symptoms to prevent spread to others.  We gave you a pill for nausea in the clinic to help with your symptoms.  You may take Zofran every 8 hours as needed for nausea and vomiting at home.  We gave you an injection of ketorolac which is a strong anti-inflammatory medicine.  You may not have any ibuprofen/Advil/Aleve/naproxen until tomorrow morning since you were given this medicine in the clinic.  You may take tylenol 1,000mg  and ibuprofen 600mg  every 6 hours with food as needed for fever/chills, sore throat, aches/pains, and inflammation associated with viral illness. Take this with food to avoid stomach upset.    Drink plenty of fluids including Pedialyte and water to stay well-hydrated.  Eat bland foods like toast, rice, applesauce, and bananas.  These foods are easy to digest and will be easy on your stomach while you are sick.  If your symptoms are not better in the next 24 hours, I would like for you to go to the nearest emergency department for further evaluation.  Otherwise, you may follow-up with your primary care provider for ongoing evaluation.  I hope you feel better!  I have refilled your hydroxyzine.  I would like for you to follow-up with your primary care provider for ongoing management of your symptoms and itching.

## 2021-12-09 NOTE — ED Triage Notes (Signed)
Started having aches and pain, chills, nausea, and complains of headache and back pain.    Has had advil.

## 2021-12-09 NOTE — ED Provider Notes (Signed)
[facility]    CSN: [id number] Arrival date & time: 12/09/21  1641      History   Chief Complaint Chief Complaint  Patient presents with   Headache   Nausea   Chills    HPI Cynthia Zimmerman is a 41 y.o. female.   Patient presents urgent care for valuation of sudden onset nausea, fever/chills, body aches, and fatigue today while she was at work.  Patient took some Advil at work but continued to feel nauseous so she left work.  She is also experiencing a generalized headache that is currently a 9 on a scale of 0-10.  Denies blurry vision, dizziness, decreased visual acuity, vomiting, diarrhea, abdominal pain, cough, nasal congestion, and sore throat.  No urinary symptoms or flank pain.  Denies chest pain, shortness of breath, and heart palpitations.  She denies history of chronic respiratory problems.  She is not a smoker and denies drug use.  Attempted use of some Advil this morning at 9 AM and states that this helped a little bit with the headache.  Patient works at [employer] and is frequently exposed to sick patients.  She has not received her flu vaccine this year but is vaccinated against COVID-19.  She is currently nauseous but has not vomited today.  Reports decreased oral intake due to nausea and concern for possible vomiting if she drinks anything. Took an at home COVID-19 test and this was negative.  Patient is requesting refill of hydroxyzine medication for generalized itching and states this has helped her significantly with itch. She does not have a rash, but experiences generalized diffuse itching. No recent exposure to known allergens/irritants.    Headache   Past Medical History:  Diagnosis Date   Abnormal Pap smear    Back pain     Patient Active Problem List   Diagnosis Date Noted   Itchy skin 09/02/2017   PREV C/S DELIV DELIV W/WO MENTION ANTPRTM COND 01/26/2009    Past Surgical History:  Procedure Laterality Date    CESAREAN SECTION     COLPOSCOPY      OB History     Gravida  7   Para  1   Term  1   Preterm      AB  3   Living  3      SAB  2   IAB  1   Ectopic      Multiple      Live Births               Home Medications    Prior to Admission medications   Medication Sig Start Date End Date Taking? Authorizing Provider  ondansetron (ZOFRAN-ODT) 4 MG disintegrating tablet Take 1 tablet (4 mg total) by mouth every 8 (eight) hours as needed for nausea or vomiting. 12/09/21  Yes Carlisle Beers, FNP  diclofenac (VOLTAREN) 75 MG EC tablet Take 1 tablet (75 mg total) by mouth 2 (two) times daily. 09/30/19   Mardella Layman, MD  hydrOXYzine (ATARAX) 25 MG tablet TAKE 1 TABLET(25 MG) BY MOUTH THREE TIMES DAILY AS NEEDED FOR ITCHING 12/09/21   Carlisle Beers, FNP  ibuprofen (ADVIL,MOTRIN) 200 MG tablet Take 200 mg by mouth every 6 (six) hours as needed. Takes for pain    [provider]  methocarbamol (ROBAXIN) 500 MG tablet Take 1 tablet (500 mg total) by mouth 2 (two) times daily. Patient not taking: Reported on 12/09/2021 04/17/20   Farrel Gordon,  PA-C  miconazole (MICOTIN) 2 % cream Apply 1 application topically 2 (two) times daily. Patient not taking: Reported on 12/09/2021 06/29/17   Wieters, Hallie C, PA-C  predniSONE (STERAPRED UNI-PAK 21 TAB) 10 MG (21) TBPK tablet Take by mouth daily. Take 6 tabs by mouth daily  for 2 days, then 5 tabs for 2 days, then 4 tabs for 2 days, then 3 tabs for 2 days, 2 tabs for 2 days, then 1 tab by mouth daily for 2 days Patient not taking: Reported on 12/09/2021 08/25/21   Ward, Tylene Fantasia, PA-C  Prenatal Vit-Fe Fumarate-FA (PRENATAL MULTIVITAMIN) TABS Take 1 tablet by mouth daily.    [provider]  promethazine (PHENERGAN) 12.5 MG tablet Take 1 tablet (12.5 mg total) by mouth every 4 (four) hours as needed for nausea. Patient not taking: Reported on 12/09/2021 02/24/12   Marny Lowenstein, PA-C   promethazine-dextromethorphan (PROMETHAZINE-DM) 6.25-15 MG/5ML syrup Take 5 mLs by mouth 4 (four) times daily as needed for cough. Patient not taking: Reported on 12/09/2021 02/02/21   Jannifer Franklin, MD    Family History History reviewed. No pertinent family history.  Social History Social History   Tobacco Use   Smoking status: Never   Smokeless tobacco: Never  Vaping Use   Vaping Use: Never used  Substance Use Topics   Alcohol use: Yes   Drug use: No     Allergies   Patient has no known allergies.   Review of Systems Review of Systems  Neurological:  Positive for headaches.  Per HPI   Physical Exam Triage Vital Signs ED Triage Vitals  Enc Vitals Group     BP 12/09/21 1738 (!) 145/84     Pulse Rate 12/09/21 1738 (!) 120     Resp 12/09/21 1738 20     Temp 12/09/21 1738 (!) 100.8 F (38.2 C)     Temp Source 12/09/21 1738 Oral     SpO2 12/09/21 1738 98 %     Weight --      Height --      Head Circumference --      Peak Flow --      Pain Score 12/09/21 1735 9     Pain Loc --      Pain Edu? --      Excl. in GC? --    No data found.  Updated Vital Signs BP (!) 145/84 (BP Location: Right Arm) Comment (BP Location): large cuff  Pulse (!) 120   Temp (!) 100.8 F (38.2 C) (Oral)   Resp 20   LMP 11/12/2021   SpO2 98%   Visual Acuity Right Eye Distance:   Left Eye Distance:   Bilateral Distance:    Right Eye Near:   Left Eye Near:    Bilateral Near:     Physical Exam Vitals and nursing note reviewed.  Constitutional:      Appearance: She is ill-appearing. She is not toxic-appearing.     Comments: Patient appears fatigued and is resting in a position of comfort on exam table.  HENT:     Head: Normocephalic and atraumatic.     Right Ear: Hearing, tympanic membrane, ear canal and external ear normal.     Left Ear: Hearing, tympanic membrane, ear canal and external ear normal.     Nose: Nose normal.     Mouth/Throat:     Lips: Pink.     Mouth:  Mucous membranes are moist.     Pharynx: No posterior oropharyngeal  erythema.  Eyes:     General: Lids are normal. Vision grossly intact. Gaze aligned appropriately.        Right eye: No discharge.        Left eye: No discharge.     Extraocular Movements: Extraocular movements intact.     Conjunctiva/sclera: Conjunctivae normal.     Pupils: Pupils are equal, round, and reactive to light.     Comments: EOMs intact without dizziness or pain elicited.  Cardiovascular:     Rate and Rhythm: Regular rhythm. Tachycardia present.     Heart sounds: Normal heart sounds, S1 normal and S2 normal.  Pulmonary:     Effort: Pulmonary effort is normal. No respiratory distress.     Breath sounds: Normal breath sounds and air entry.     Comments: No adventitious breath sounds, no respiratory distress.  Patient speaking in full sentences and comfortable on room air. Abdominal:     General: Bowel sounds are normal.     Palpations: Abdomen is soft.     Tenderness: There is no abdominal tenderness. There is no right CVA tenderness, left CVA tenderness or guarding.     Comments: No peritoneal signs. Benign abdomen.  Musculoskeletal:     Cervical back: Normal range of motion and neck supple. No tenderness.  Lymphadenopathy:     Cervical: Cervical adenopathy present.  Skin:    General: Skin is warm and dry.     Capillary Refill: Capillary refill takes less than 2 seconds.     Findings: No rash.  Neurological:     General: No focal deficit present.     Mental Status: She is alert and oriented to person, place, and time. Mental status is at baseline.     Cranial Nerves: No dysarthria or facial asymmetry.     Motor: No weakness.     Gait: Gait normal.     Comments: Nonfocal neuro exam.  5/5 strength throughout.  Psychiatric:        Mood and Affect: Mood normal.        Speech: Speech normal.        Behavior: Behavior normal.        Thought Content: Thought content normal.        Judgment: Judgment  normal.      UC Treatments / Results  Labs (all labs ordered are listed, but only abnormal results are displayed) Labs Reviewed  RESP PANEL BY RT-PCR (FLU A&B, COVID) [id number]    EKG   Radiology No results found.  Procedures Procedures (including critical care time)  Medications Ordered in UC Medications  acetaminophen (TYLENOL) tablet 975 mg (975 mg Oral Given 12/09/21 1807)  ketorolac (TORADOL) 30 MG/ML injection 30 mg (30 mg Intramuscular Given 12/09/21 1807)  ondansetron (ZOFRAN-ODT) disintegrating tablet 4 mg (4 mg Oral Given 12/09/21 1818)    Initial Impression / Assessment and Plan / UC Course  I have reviewed the triage vital signs and the nursing notes.  Pertinent labs & imaging results that were available during my care of the patient were reviewed by me and considered in my medical decision making (see chart for details).   Viral URI with cough Symptoms and physical exam consistent with a viral upper respiratory tract infection that will likely resolve with rest, fluids, and prescriptions for symptomatic relief. No indication for imaging today based on stable cardiopulmonary exam and hemodynamically stable vital signs. COVID-19 and flu testing is pending.  We will call patient if this is positive.  Quarantine  guidelines discussed. Currently on day 1 of symptoms and does qualify for antiviral therapy.   Patient given zofran ODT 4mg , ketorolac 30mg , and 975 of tylenol in clinic today for headache, nausea, and fever.  Zofran 4mg  ODT every 8 hours and hydroxyzine sent to pharmacy for symptomatic relief to be taken as prescribed.   No NSAIDS for 24 hours after ketorolac injection, may use tylenol every 6 hours as needed for pain/fever at home. May begin using ibuprofen tomorrow 600mg  every 6 hours as needed for pain, inflammation, and fever/chills. Work note given.  Heart rate remains elevated when re-checked at 115 beats per minute. Patient remains non-toxic in  appearance and will likely do well with oral rehydration at home. Advised to increase fluid intake and eat bland foods over the next 12-24 hours. Pedialyte and other electrolyte supplement drinks recommended along with increased water intake. If symptoms have not improved in the next 12-24 hours, patient to go to the ER for further evaluation.  Strict ED/urgent care return precautions given.  Patient verbalizes understanding and agreement with plan.  Counseled patient regarding possible side effects and uses of all medications prescribed at today's visit.  Patient verbalizes understanding and agreement with plan.  All questions answered.  Patient discharged from urgent care in stable condition.       Final Clinical Impressions(s) / UC Diagnoses   Final diagnoses:  Influenza-like illness  Fever, unspecified  Nausea without vomiting  Dehydration     Discharge Instructions      You have a viral upper respiratory infection.  COVID-19 and flu testing is pending. We will call you with results if positive. If your COVID test is positive, you must stay at home until day 6 of symptoms. On day 6, you may go out into public and go back to work, but you must wear a mask until day 11 of symptoms to prevent spread to others.  We gave you a pill for nausea in the clinic to help with your symptoms.  You may take Zofran every 8 hours as needed for nausea and vomiting at home.  We gave you an injection of ketorolac which is a strong anti-inflammatory medicine.  You may not have any ibuprofen/Advil/Aleve/naproxen until tomorrow morning since you were given this medicine in the clinic.  You may take tylenol 1,000mg  and ibuprofen 600mg  every 6 hours with food as needed for fever/chills, sore throat, aches/pains, and inflammation associated with viral illness. Take this with food to avoid stomach upset.    Drink plenty of fluids including Pedialyte and water to stay well-hydrated.  Eat bland foods like toast,  rice, applesauce, and bananas.  These foods are easy to digest and will be easy on your stomach while you are sick.  If your symptoms are not better in the next 24 hours, I would like for you to go to the nearest emergency department for further evaluation.  Otherwise, you may follow-up with your primary care provider for ongoing evaluation.  I hope you feel better!  I have refilled your hydroxyzine.  I would like for you to follow-up with your primary care provider for ongoing management of your symptoms and itching.     ED Prescriptions     Medication Sig Dispense Auth. Provider   hydrOXYzine (ATARAX) 25 MG tablet TAKE 1 TABLET(25 MG) BY MOUTH THREE TIMES DAILY AS NEEDED FOR ITCHING 30 tablet , FNP   ondansetron (ZOFRAN-ODT) 4 MG disintegrating tablet Take 1 tablet (4 mg total) by  mouth every 8 (eight) hours as needed for nausea or vomiting. 20 tablet Carlisle BeersStanhope, Albaraa Swingle M, FNP      PDMP not reviewed this encounter.   Carlisle BeersStanhope, Lexany Belknap M, [place]NP 12/09/21 [phone]

## 2021-12-09 NOTE — ED Notes (Signed)
Denied tylenol-says "it does not work on her"

## 2021-12-09 NOTE — ED Notes (Signed)
Called patient on cell phone listed

## 2022-04-27 ENCOUNTER — Ambulatory Visit (HOSPITAL_COMMUNITY)
Admission: EM | Admit: 2022-04-27 | Discharge: 2022-04-27 | Disposition: A | Discharge status: Home / Self Care | Payer: Managed Care, Other (non HMO) | Attending: Family Medicine | Admitting: Family Medicine

## 2022-04-27 ENCOUNTER — Encounter (HOSPITAL_COMMUNITY): Payer: Self-pay

## 2022-04-27 DIAGNOSIS — L299 Pruritus, unspecified: Secondary | ICD-10-CM

## 2022-04-27 DIAGNOSIS — M5432 Sciatica, left side: Secondary | ICD-10-CM

## 2022-04-27 DIAGNOSIS — M62830 Muscle spasm of back: Secondary | ICD-10-CM

## 2022-04-27 LAB — POCT URINALYSIS DIPSTICK, ED / UC
Bilirubin Urine: NEGATIVE
Glucose, UA: NEGATIVE mg/dL
Hgb urine dipstick: NEGATIVE
Ketones, ur: NEGATIVE mg/dL
Leukocytes,Ua: NEGATIVE
Nitrite: NEGATIVE
Protein, ur: NEGATIVE mg/dL
Specific Gravity, Urine: 1.015 (ref 1.005–1.030)
Urobilinogen, UA: 0.2 mg/dL (ref 0.0–1.0)
pH: 8.5 — ABNORMAL HIGH (ref 5.0–8.0)

## 2022-04-27 MED ORDER — BACLOFEN 10 MG PO TABS: 10.0000 mg | ORAL_TABLET | Freq: Three times a day (TID) | ORAL | 0 refills | Status: AC

## 2022-04-27 MED ORDER — HYDROXYZINE HCL 25 MG PO TABS: ORAL_TABLET | ORAL | 0 refills | Status: AC

## 2022-04-27 MED ORDER — PREDNISONE 10 MG (21) PO TBPK: ORAL_TABLET | Freq: Every day | ORAL | 0 refills | Status: AC
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
## 2022-04-27 NOTE — Discharge Instructions (Addendum)
Instructed patient to take medications as directed with food to completion.  Advised patient to discontinue Robaxin and may take baclofen daily or as needed for accompanying muscle spasm of the left lower back encourage patient to increase daily water intake to 64 ounces per day while taking these medications.  Advised if symptoms worsen and/or unresolved please follow-up with PCP or here for further evaluation.

## 2023-03-23 ENCOUNTER — Ambulatory Visit (HOSPITAL_COMMUNITY)
Admission: EM | Admit: 2023-03-23 | Discharge: 2023-03-23 | Disposition: A | Discharge status: Home / Self Care | Payer: Managed Care, Other (non HMO) | Attending: Family Medicine | Admitting: Family Medicine

## 2023-03-23 ENCOUNTER — Encounter (HOSPITAL_COMMUNITY): Payer: Self-pay

## 2023-03-23 DIAGNOSIS — B349 Viral infection, unspecified: Secondary | ICD-10-CM | POA: Diagnosis not present

## 2023-03-23 LAB — POC COVID19/FLU A&B COMBO
Covid Antigen, POC: NEGATIVE
Influenza A Antigen, POC: NEGATIVE
Influenza B Antigen, POC: NEGATIVE

## 2023-03-23 MED ORDER — ONDANSETRON 4 MG PO TBDP
4.0000 mg | ORAL_TABLET | Freq: Once | ORAL | Status: AC
  Administered 2023-03-23: 4 mg via ORAL

## 2023-03-23 MED ORDER — ONDANSETRON 4 MG PO TBDP: 4.0000 mg | ORAL_TABLET | Freq: Three times a day (TID) | ORAL | 0 refills | Status: DC | PRN

## 2023-03-23 MED ORDER — ONDANSETRON 4 MG PO TBDP
ORAL_TABLET | ORAL | Status: AC
Start: 2023-03-23 — End: ?
  Filled 2023-03-23: qty 1

## 2023-03-23 MED ORDER — ACETAMINOPHEN 325 MG PO TABS
ORAL_TABLET | ORAL | Status: AC
  Filled 2023-03-23: qty 2

## 2023-03-23 MED ORDER — ACETAMINOPHEN 325 MG PO TABS
650.0000 mg | ORAL_TABLET | Freq: Once | ORAL | Status: AC
Start: 2023-03-23 — End: 2023-03-23
  Administered 2023-03-23: 650 mg via ORAL

## 2023-03-23 NOTE — ED Notes (Signed)
 Upon discharging patient she began to ask for tylenol and also zofran because she "feels to bad" to pick up her medication.

## 2023-03-23 NOTE — ED Triage Notes (Signed)
 Pt reports she has sob, body aches, n/v, chills, and weak x 1 day   Kids have the flu.

## 2023-03-23 NOTE — Discharge Instructions (Signed)
 It was nice seeing you today. I am sorry about your stomach symptoms. Thankfully, your COVID-19 and influenza tests are both negative. Your diarrhea could be due to norovirus, which causes diarrhea and vomiting. Please rest at home and stay well hydrated.

## 2023-03-23 NOTE — ED Provider Notes (Signed)
 [facility]    CSN: [id number] Arrival date & time: 03/23/23  1002      History   Chief Complaint No chief complaint on file.   HPI Cynthia Zimmerman is a 43 y.o. female.   The history is provided by the patient. No language interpreter was used.  Diarrhea Quality:  Watery Severity:  Mild Onset quality:  Gradual Number of episodes:  5 Duration:  24 hours Timing:  Intermittent Progression:  Unchanged Relieved by:  Nothing Worsened by:  Nothing Ineffective treatments:  None tried Associated symptoms: chills, headaches, myalgias and vomiting   Associated symptoms: no abdominal pain, no recent cough and no fever   Associated symptoms comment:  She vomited twice this morning. She took her daughter's Zofran at home. Her tummy rumbles when she moves her bowel. She feels weak and tired Risk factors: sick contacts   Risk factors comment:  Her two daughters were sick at home. They both have Influenza viral illness, tested positive   Past Medical History:  Diagnosis Date   Abnormal Pap smear    Back pain     Patient Active Problem List   Diagnosis Date Noted   Itchy skin 09/02/2017   Previous cesarean delivery, delivered 01/26/2009    Past Surgical History:  Procedure Laterality Date   CESAREAN SECTION     COLPOSCOPY      OB History     Gravida  7   Para  1   Term  1   Preterm      AB  3   Living  3      SAB  2   IAB  1   Ectopic      Multiple      Live Births               Home Medications    Prior to Admission medications   Medication Sig Start Date End Date Taking? Authorizing Provider  ivermectin (STROMECTOL) 3 MG TABS tablet Take by mouth. 01/03/20  Yes [provider]  baclofen (LIORESAL) 10 MG tablet Take 1 tablet (10 mg total) by mouth 3 (three) times daily. 04/27/22   Trevor Iha, FNP  diclofenac (VOLTAREN) 75 MG EC tablet Take 1 tablet (75 mg total) by mouth 2 (two) times daily. 09/30/19   Mardella Layman, MD   hydrOXYzine (ATARAX) 25 MG tablet TAKE 1 TABLET(25 MG) BY MOUTH THREE TIMES DAILY AS NEEDED FOR ITCHING 04/27/22   Trevor Iha, FNP  ibuprofen (ADVIL,MOTRIN) 200 MG tablet Take 200 mg by mouth every 6 (six) hours as needed. Takes for pain    [provider]  ondansetron (ZOFRAN-ODT) 4 MG disintegrating tablet Take 1 tablet (4 mg total) by mouth every 8 (eight) hours as needed for nausea or vomiting. 03/23/23   Doreene Eland, MD  predniSONE (STERAPRED UNI-PAK 21 TAB) 10 MG (21) TBPK tablet Take by mouth daily. Take 6 tabs by mouth daily  for 2 days, then 5 tabs for 2 days, then 4 tabs for 2 days, then 3 tabs for 2 days, 2 tabs for 2 days, then 1 tab by mouth daily for 2 days 04/27/22   Trevor Iha, FNP  Prenatal Vit-Fe Fumarate-FA (PRENATAL MULTIVITAMIN) TABS Take 1 tablet by mouth daily.    [provider]  promethazine (PHENERGAN) 12.5 MG tablet Take 1 tablet (12.5 mg total) by mouth every 4 (four) hours as needed for nausea. Patient not taking: Reported on 12/09/2021 02/24/12   Vonzella Nipple  N, PA-C  promethazine-dextromethorphan (PROMETHAZINE-DM) 6.25-15 MG/5ML syrup Take 5 mLs by mouth 4 (four) times daily as needed for cough. Patient not taking: Reported on 12/09/2021 02/02/21   Jannifer Franklin, MD    Family History History reviewed. No pertinent family history.  Social History Social History   Tobacco Use   Smoking status: Never   Smokeless tobacco: Never  Vaping Use   Vaping status: Never Used  Substance Use Topics   Alcohol use: Yes   Drug use: No     Allergies   Patient has no known allergies.   Review of Systems Review of Systems  Constitutional:  Positive for chills. Negative for fever.  Gastrointestinal:  Positive for diarrhea and vomiting. Negative for abdominal pain.  Musculoskeletal:  Positive for myalgias.  Neurological:  Positive for headaches.     Physical Exam Triage Vital Signs ED Triage Vitals  Encounter Vitals Group     BP       Systolic BP Percentile      Diastolic BP Percentile      Pulse      Resp      Temp      Temp src      SpO2      Weight      Height      Head Circumference      Peak Flow      Pain Score      Pain Loc      Pain Education      Exclude from Growth Chart    No data found.  Updated Vital Signs BP 139/79 (BP Location: Left Arm)   Pulse 100   Temp 98.9 F (37.2 C) (Oral)   Resp 20   LMP 01/07/2023   SpO2 97%   Visual Acuity Right Eye Distance:   Left Eye Distance:   Bilateral Distance:    Right Eye Near:   Left Eye Near:    Bilateral Near:     Physical Exam Vitals and nursing note reviewed.  Constitutional:      Appearance: Normal appearance. She is not toxic-appearing.  Eyes:     Conjunctiva/sclera: Conjunctivae normal.     Pupils: Pupils are equal, round, and reactive to light.  Cardiovascular:     Rate and Rhythm: Normal rate and regular rhythm.     Heart sounds: Normal heart sounds. No murmur heard. Pulmonary:     Effort: Pulmonary effort is normal. No respiratory distress.     Breath sounds: Normal breath sounds. No stridor. No wheezing.  Abdominal:     General: Abdomen is flat. Bowel sounds are normal. There is no distension.     Palpations: Abdomen is soft.     Tenderness: There is no abdominal tenderness.      UC Treatments / Results  Labs (all labs ordered are listed, but only abnormal results are displayed) Labs Reviewed  POC COVID19/FLU A&B COMBO    EKG   Radiology No results found.  Procedures Procedures (including critical care time)  Medications Ordered in UC Medications - No data to display  Initial Impression / Assessment and Plan / UC Course  I have reviewed the triage vital signs and the nursing notes.  Pertinent labs & imaging results that were available during my care of the patient were reviewed by me and considered in my medical decision making (see chart for details).  Clinical Course as of 03/23/23 1044  Sun Mar 23, 2023  1043 Gastroenteritis: Likely viral infection Tylenol/Ibuprofen  as needed for pain.  She has Ibuprofen at home Zofran prn N/V escribed Keep well hydrated and rest at home with close PCP follow [KE]    Clinical Course User Index [KE] Doreene Eland, MD    Final Clinical Impressions(s) / UC Diagnoses   Final diagnoses:  Viral syndrome     Discharge Instructions      It was nice seeing you today. I am sorry about your stomach symptoms. Thankfully, your COVID-19 and influenza tests are both negative. Your diarrhea could be due to norovirus, which causes diarrhea and vomiting. Please rest at home and stay well hydrated.      ED Prescriptions     Medication Sig Dispense Auth. Provider   ondansetron (ZOFRAN-ODT) 4 MG disintegrating tablet Take 1 tablet (4 mg total) by mouth every 8 (eight) hours as needed for nausea or vomiting. 15 tablet Doreene Eland, MD      PDMP not reviewed this encounter.   Doreene Eland, MD 03/23/23 1044

## 2023-05-01 ENCOUNTER — Ambulatory Visit (HOSPITAL_COMMUNITY): Admission: EM | Admit: 2023-05-01 | Discharge: 2023-05-01 | Disposition: A | Discharge status: Home / Self Care

## 2023-05-01 ENCOUNTER — Encounter (HOSPITAL_COMMUNITY): Payer: Self-pay | Admitting: Emergency Medicine

## 2023-05-01 DIAGNOSIS — A084 Viral intestinal infection, unspecified: Secondary | ICD-10-CM

## 2023-05-01 MED ORDER — ONDANSETRON 4 MG PO TBDP: 4.0000 mg | ORAL_TABLET | Freq: Three times a day (TID) | ORAL | 0 refills | Status: AC | PRN

## 2023-05-01 NOTE — ED Triage Notes (Signed)
 Pt reports on Monday after work had fatigue, nausea, decreased appetite, did vomit once.

## 2023-05-01 NOTE — ED Provider Notes (Signed)
 [facility]  Note:  This document was prepared using Dragon voice recognition software and may include unintentional dictation errors.  MRN: [id number] DOB: 08-13-1980  Subjective:   Cynthia Zimmerman is a 43 y.o. female presenting for fatigue, nausea, vomiting, diminished appetite since Monday night.  Denies any diarrhea, abdominal pain, dysuria, flank pain, increased urinary frequency.  No known sick contacts with influenza or COVID.  No shortness of breath, chest pain, weakness, dizziness  No current facility-administered medications for this encounter.  Current Outpatient Medications:    baclofen (LIORESAL) 10 MG tablet, Take 1 tablet (10 mg total) by mouth 3 (three) times daily., Disp: 30 each, Rfl: 0   diclofenac (VOLTAREN) 75 MG EC tablet, Take 1 tablet (75 mg total) by mouth 2 (two) times daily., Disp: 14 tablet, Rfl: 0   hydrOXYzine (ATARAX) 25 MG tablet, TAKE 1 TABLET(25 MG) BY MOUTH THREE TIMES DAILY AS NEEDED FOR ITCHING, Disp: 30 tablet, Rfl: 0   ibuprofen (ADVIL,MOTRIN) 200 MG tablet, Take 200 mg by mouth every 6 (six) hours as needed. Takes for pain, Disp: , Rfl:    ivermectin (STROMECTOL) 3 MG TABS tablet, Take by mouth., Disp: , Rfl:    ondansetron (ZOFRAN-ODT) 4 MG disintegrating tablet, Take 1 tablet (4 mg total) by mouth every 8 (eight) hours as needed for nausea or vomiting., Disp: 27 tablet, Rfl: 0   predniSONE (STERAPRED UNI-PAK 21 TAB) 10 MG (21) TBPK tablet, Take by mouth daily. Take 6 tabs by mouth daily  for 2 days, then 5 tabs for 2 days, then 4 tabs for 2 days, then 3 tabs for 2 days, 2 tabs for 2 days, then 1 tab by mouth daily for 2 days, Disp: 42 tablet, Rfl: 0   Prenatal Vit-Fe Fumarate-FA (PRENATAL MULTIVITAMIN) TABS, Take 1 tablet by mouth daily., Disp: , Rfl:    promethazine (PHENERGAN) 12.5 MG tablet, Take 1 tablet (12.5 mg total) by mouth every 4 (four) hours as needed for nausea. (Patient not taking: Reported on 12/09/2021), Disp: 30 tablet,  Rfl: 0   promethazine-dextromethorphan (PROMETHAZINE-DM) 6.25-15 MG/5ML syrup, Take 5 mLs by mouth 4 (four) times daily as needed for cough. (Patient not taking: Reported on 12/09/2021), Disp: 118 mL, Rfl: 0   No Known Allergies  Past Medical History:  Diagnosis Date   Abnormal Pap smear    Back pain      Past Surgical History:  Procedure Laterality Date   CESAREAN SECTION     COLPOSCOPY      No family history on file.  Social History   Tobacco Use   Smoking status: Never   Smokeless tobacco: Never  Vaping Use   Vaping status: Never Used  Substance Use Topics   Alcohol use: Yes   Drug use: No    ROS Refer to HPI for ROS details.  Objective:   Vitals: BP 120/82 (BP Location: Right Arm)   Pulse 78   Temp 97.7 F (36.5 C) (Oral)   Resp 18   SpO2 98%   Physical Exam Vitals and nursing note reviewed.  Constitutional:      General: She is not in acute distress.    Appearance: Normal appearance. She is not ill-appearing or toxic-appearing.  HENT:     Head: Normocephalic.     Nose: Nose normal. No congestion or rhinorrhea.     Mouth/Throat:     Mouth: Mucous membranes are moist.     Pharynx: Oropharynx is clear.  Eyes:     Extraocular  Movements: Extraocular movements intact.     Conjunctiva/sclera: Conjunctivae normal.  Cardiovascular:     Rate and Rhythm: Normal rate.  Pulmonary:     Effort: Pulmonary effort is normal. No respiratory distress.  Abdominal:     General: There is no distension.     Palpations: Abdomen is soft.     Tenderness: There is no abdominal tenderness. There is no right CVA tenderness, left CVA tenderness or guarding.  Skin:    General: Skin is warm and dry.  Neurological:     General: No focal deficit present.     Mental Status: She is alert and oriented to person, place, and time.  Psychiatric:        Mood and Affect: Mood normal.        Behavior: Behavior normal.     Procedures  No results found for this or any previous  visit (from the past 24 hours).  Assessment and Plan :   PDMP not reviewed this encounter.  1. Viral gastroenteritis    1. Viral gastroenteritis (Primary) - ondansetron (ZOFRAN-ODT) 4 MG disintegrating tablet; Take 1 tablet (4 mg total) by mouth every 8 (eight) hours as needed for nausea or vomiting.  Dispense: 27 tablet; Refill: 0 -Continue to take medications as directed, eat light foods, drink plenty of fluids, rest and continue to monitor symptoms if there is any escalation of current symptoms or development of new symptoms follow-up for further evaluation and management in the emergency department. -Continue to monitor symptoms for any change in severity if there is any escalation of current symptoms or development of new symptoms follow-up in ER for further evaluation and management.  [name]   [place], [place], [place] 05/01/23 1114

## 2023-05-01 NOTE — Discharge Instructions (Addendum)
 1. Viral gastroenteritis (Primary) - ondansetron (ZOFRAN-ODT) 4 MG disintegrating tablet; Take 1 tablet (4 mg total) by mouth every 8 (eight) hours as needed for nausea or vomiting.  Dispense: 27 tablet; Refill: 0 -Continue to take medications as directed, eat light foods, drink plenty of fluids, rest and continue to monitor symptoms if there is any escalation of current symptoms or development of new symptoms follow-up for further evaluation and management in the emergency department.

## 2024-02-19 ENCOUNTER — Other Ambulatory Visit: Payer: Self-pay | Admitting: Internal Medicine

## 2024-02-19 DIAGNOSIS — Z1231 Encounter for screening mammogram for malignant neoplasm of breast: Secondary | ICD-10-CM
